# Patient Record
Sex: Male | Born: 1980 | Race: White | Hispanic: No | Marital: Single | Smoking: Never smoker
Health system: Southern US, Community
[De-identification: ages and names within clinical notes are randomized; demographics above are authoritative.]

## PROBLEM LIST (undated history)

## (undated) DIAGNOSIS — N200 Calculus of kidney: Secondary | ICD-10-CM

## (undated) HISTORY — PX: HERNIA REPAIR: SHX51

---

## 1999-09-07 ENCOUNTER — Emergency Department (HOSPITAL_COMMUNITY): Admission: EM | Admit: 1999-09-07 | Discharge: 1999-09-07 | Payer: Self-pay | Admitting: Emergency Medicine

## 1999-09-19 ENCOUNTER — Emergency Department (HOSPITAL_COMMUNITY): Admission: EM | Admit: 1999-09-19 | Discharge: 1999-09-19 | Payer: Self-pay | Admitting: *Deleted

## 2006-12-11 ENCOUNTER — Emergency Department (HOSPITAL_COMMUNITY): Admission: EM | Admit: 2006-12-11 | Discharge: 2006-12-11 | Payer: Self-pay | Admitting: Emergency Medicine

## 2007-04-16 ENCOUNTER — Emergency Department (HOSPITAL_COMMUNITY): Admission: EM | Admit: 2007-04-16 | Discharge: 2007-04-16 | Payer: Self-pay | Admitting: Emergency Medicine

## 2008-06-13 ENCOUNTER — Emergency Department (HOSPITAL_COMMUNITY): Admission: EM | Admit: 2008-06-13 | Discharge: 2008-06-13 | Payer: Self-pay | Admitting: Emergency Medicine

## 2011-03-29 LAB — DIFFERENTIAL
Eosinophils Relative: 1 % (ref 0–5)
Lymphs Abs: 1.7 10*3/uL (ref 0.7–4.0)
Monocytes Relative: 6 % (ref 3–12)
Neutrophils Relative %: 79 % — ABNORMAL HIGH (ref 43–77)

## 2011-03-29 LAB — URINALYSIS, ROUTINE W REFLEX MICROSCOPIC
Bilirubin Urine: NEGATIVE
Ketones, ur: NEGATIVE mg/dL
Nitrite: NEGATIVE
Protein, ur: NEGATIVE mg/dL
Urobilinogen, UA: 0.2 mg/dL (ref 0.0–1.0)

## 2011-03-29 LAB — POCT I-STAT, CHEM 8
Creatinine, Ser: 1.1 mg/dL (ref 0.4–1.5)
Glucose, Bld: 188 mg/dL — ABNORMAL HIGH (ref 70–99)
HCT: 47 % (ref 39.0–52.0)
Hemoglobin: 16 g/dL (ref 13.0–17.0)
Sodium: 141 mEq/L (ref 135–145)
TCO2: 27 mmol/L (ref 0–100)

## 2011-03-29 LAB — CBC
Hemoglobin: 15.1 g/dL (ref 13.0–17.0)
RBC: 5.1 MIL/uL (ref 4.22–5.81)
WBC: 11.3 10*3/uL — ABNORMAL HIGH (ref 4.0–10.5)

## 2011-04-03 LAB — CBC
HCT: 45.7
MCV: 85.6
Platelets: 304
RBC: 5.34

## 2011-04-03 LAB — COMPREHENSIVE METABOLIC PANEL
Alkaline Phosphatase: 112
CO2: 27
Calcium: 9.4
GFR calc Af Amer: >60
Glucose, Bld: 154 — ABNORMAL HIGH
Sodium: 133 — ABNORMAL LOW
Total Bilirubin: 1.2

## 2011-04-03 LAB — LIPASE, BLOOD: Lipase: 20

## 2011-05-07 ENCOUNTER — Emergency Department (HOSPITAL_COMMUNITY)
Admission: EM | Admit: 2011-05-07 | Discharge: 2011-05-08 | Disposition: A | Discharge status: Home / Self Care | Payer: BC Managed Care – PPO | Attending: Emergency Medicine | Admitting: Emergency Medicine

## 2011-05-07 DIAGNOSIS — R55 Syncope and collapse: Secondary | ICD-10-CM | POA: Insufficient documentation

## 2011-05-07 DIAGNOSIS — R42 Dizziness and giddiness: Secondary | ICD-10-CM | POA: Insufficient documentation

## 2011-05-07 HISTORY — DX: Calculus of kidney: N20.0

## 2011-05-08 ENCOUNTER — Other Ambulatory Visit: Payer: Self-pay

## 2011-05-08 ENCOUNTER — Encounter: Payer: Self-pay | Admitting: *Deleted

## 2011-05-08 LAB — DIFFERENTIAL
Eosinophils Relative: 1 % (ref 0–5)
Lymphocytes Relative: 35 % (ref 12–46)
Lymphs Abs: 2.6 10*3/uL (ref 0.7–4.0)
Monocytes Absolute: 0.3 10*3/uL (ref 0.1–1.0)
Monocytes Relative: 5 % (ref 3–12)
Neutrophils Relative %: 59 % (ref 43–77)

## 2011-05-08 LAB — CBC
Hemoglobin: 15.1 g/dL (ref 13.0–17.0)
MCHC: 35.1 g/dL (ref 30.0–36.0)
RDW: 12.7 % (ref 11.5–15.5)
WBC: 7.4 10*3/uL (ref 4.0–10.5)

## 2011-05-08 LAB — URINALYSIS, ROUTINE W REFLEX MICROSCOPIC
Bilirubin Urine: NEGATIVE
Hgb urine dipstick: NEGATIVE
Ketones, ur: NEGATIVE mg/dL
Protein, ur: NEGATIVE mg/dL
Urobilinogen, UA: 0.2 mg/dL (ref 0.0–1.0)
pH: 6.5 (ref 5.0–8.0)

## 2011-05-08 LAB — BASIC METABOLIC PANEL
BUN: 14 mg/dL (ref 6–23)
Calcium: 9.7 mg/dL (ref 8.4–10.5)
GFR calc non Af Amer: >90 mL/min (ref >90)

## 2011-05-08 NOTE — ED Notes (Signed)
SR  On monitor, rate 72, Denies pain or dizziness at this time

## 2011-05-08 NOTE — ED Notes (Signed)
C/o dizziness at 2045 when leaving work.  Denies syncope, pain , SOB, fever , nausea or vomiting.

## 2011-05-08 NOTE — ED Notes (Addendum)
C/o episode of dizziness while walking out of work around 2045, seen by EMS at home, encouraged to come to ED, "wasn't sure if it was anxiety", (denies: tingling, weakness), "feels like adrenalin flowing thru me". "Wasn't sure if it was r/t blood sugar, ate ice cream prior to EMS, but hasn't eaten since 1300".

## 2011-05-08 NOTE — ED Provider Notes (Signed)
History     CSN: [id number] Arrival date & time: 05/07/2011 11:59 PM   First MD Initiated Contact with Patient 05/08/11 0220      Chief Complaint  Patient presents with  . Dizziness    (Consider location/radiation/quality/duration/timing/severity/associated sxs/prior treatment) HPI 30 year old male presents to emergency department with report of dizziness and feeling like he was when pass out tonight around 9 PM after leaving work. Patient denies any chest pain, shortness of breath, vertigo. No prior history of same. Patient denies any medical problems. Patient reports some cold symptoms over the weekend with sinus drainage and stomach upset. Today he worked a normal shift ate lunch around noon and had peanuts around 1:30 had not eaten is that time when he began to feel dizzy 9 hours later. Past Medical History  Diagnosis Date  . Kidney stone     Past Surgical History  Procedure Date  . Hernia repair     Family History  Problem Relation Age of Onset  . Asthma Mother   . Diabetes Father   . Coronary artery disease Father   . Hemochromatosis Father     History  Substance Use Topics  . Smoking status: Never Smoker   . Smokeless tobacco: Not on file  . Alcohol Use: Yes     socially      Review of Systems  Neurological: Positive for dizziness.  All other systems reviewed and are negative.    Allergies  Review of patient's allergies indicates no known allergies.  Home Medications   Current Outpatient Rx  Name Route Sig Dispense Refill  . ACETAMINOPHEN 325 MG PO TABS Oral Take 325 mg by mouth every 6 (six) hours as needed. For pain     . THERA M PLUS PO TABS Oral Take 1 tablet by mouth daily.        BP 118/71  Pulse 49  Temp(Src) 98.4 F (36.9 C) (Oral)  Resp 19  SpO2 100%  Physical Exam  Nursing note and vitals reviewed. Constitutional: He is oriented to person, place, and time. He appears well-developed and well-nourished.  HENT:  Head:  Normocephalic and atraumatic.  Right Ear: External ear normal.  Left Ear: External ear normal.  Nose: Nose normal.  Mouth/Throat: Oropharynx is clear and moist.  Eyes: Conjunctivae and EOM are normal. Pupils are equal, round, and reactive to light.  Neck: Normal range of motion. Neck supple. No JVD present. No tracheal deviation present. No thyromegaly present.  Cardiovascular: Normal rate, regular rhythm, normal heart sounds and intact distal pulses.  Exam reveals no gallop and no friction rub.   No murmur heard. Pulmonary/Chest: Effort normal and breath sounds normal. No stridor. No respiratory distress. He has no wheezes. He has no rales. He exhibits no tenderness.  Abdominal: Soft. Bowel sounds are normal. He exhibits no distension and no mass. There is no tenderness. There is no rebound and no guarding.  Musculoskeletal: Normal range of motion. He exhibits no edema and no tenderness.  Lymphadenopathy:    He has no cervical adenopathy.  Neurological: He is oriented to person, place, and time. He has normal reflexes. No cranial nerve deficit. He exhibits normal muscle tone. Coordination normal.  Skin: Skin is dry. No rash noted. No erythema. No pallor.  Psychiatric: He has a normal mood and affect. His behavior is normal. Judgment and thought content normal.    ED Course  Procedures (including critical care time)  Labs Reviewed  URINALYSIS, ROUTINE W REFLEX MICROSCOPIC - Abnormal; Notable for  the following:    Glucose, UA 100 (*)    All other components within normal limits  CBC  DIFFERENTIAL  BASIC METABOLIC PANEL   No results found.   1. Near syncope     Date: 05/08/2011  Rate:69   Rhythm: normal sinus rhythm  QRS Axis: normal  Intervals: normal  ST/T Wave abnormalities: normal  Conduction Disutrbances:none  Narrative Interpretation:   Old EKG Reviewed: none available    MDM  30 year old male with near syncope. Exam, lab work, EKG all negative. Suspect secondary  to recent illness and prolonged period without eating or drinking. Patient encouraged to eat regular meals and drink plan fluids        Olivia Mackie, MD 05/08/11 [phone]

## 2017-04-07 ENCOUNTER — Ambulatory Visit (INDEPENDENT_AMBULATORY_CARE_PROVIDER_SITE_OTHER): Payer: BLUE CROSS/BLUE SHIELD

## 2017-04-07 ENCOUNTER — Ambulatory Visit (INDEPENDENT_AMBULATORY_CARE_PROVIDER_SITE_OTHER): Payer: BLUE CROSS/BLUE SHIELD | Admitting: Podiatry

## 2017-04-07 ENCOUNTER — Encounter: Payer: Self-pay | Admitting: Podiatry

## 2017-04-07 VITALS — BP 132/89 | HR 69 | Resp 18

## 2017-04-07 DIAGNOSIS — M722 Plantar fascial fibromatosis: Secondary | ICD-10-CM | POA: Diagnosis not present

## 2017-04-07 DIAGNOSIS — J302 Other seasonal allergic rhinitis: Secondary | ICD-10-CM | POA: Insufficient documentation

## 2017-04-07 DIAGNOSIS — M779 Enthesopathy, unspecified: Secondary | ICD-10-CM

## 2017-04-07 DIAGNOSIS — N200 Calculus of kidney: Secondary | ICD-10-CM | POA: Insufficient documentation

## 2017-04-07 DIAGNOSIS — S93401A Sprain of unspecified ligament of right ankle, initial encounter: Secondary | ICD-10-CM

## 2017-04-07 MED ORDER — METHYLPREDNISOLONE 4 MG PO TBPK: ORAL_TABLET | ORAL | 0 refills | Status: AC

## 2017-04-07 MED ORDER — MELOXICAM 15 MG PO TABS: 15.0000 mg | ORAL_TABLET | Freq: Every day | ORAL | 2 refills | Status: AC

## 2017-04-07 NOTE — Patient Instructions (Signed)
Start with the medrol dose pack and once complete you can start the meloxicam. Do not take together.    Achilles Tendinitis Rehab Ask your health care provider which exercises are safe for you. Do exercises exactly as told by your health care provider and adjust them as directed. It is normal to feel mild stretching, pulling, tightness, or discomfort as you do these exercises, but you should stop right away if you feel sudden pain or your pain gets worse. Do not begin these exercises until told by your health care provider. Stretching and range of motion exercises These exercises warm up your muscles and joints and improve the movement and flexibility of your ankle. These exercises also help to relieve pain, numbness, and tingling. Exercise A: Standing wall calf stretch, knee straight  1. Stand with your hands against a wall. 2. Extend your __________ leg behind you and bend your front knee slightly. Keep both of your heels on the floor. 3. Point the toes of your back foot slightly inward. 4. Keeping your heels on the floor and your back knee straight, shift your weight toward the wall. Do not allow your back to arch. You should feel a gentle stretch in your calf. 5. Hold this position for seconds. Repeat __________ times. Complete this stretch __________ times per day. Exercise B: Standing wall calf stretch, knee bent 1. Stand with your hands against a wall. 2. Extend your __________ leg behind you, and bend your front knee slightly. Keep both of your heels on the floor. 3. Point the toes of your back foot slightly inward. 4. Keeping your heels on the floor, unlock your back knee so that it is bent. You should feel a gentle stretch deep in your calf. 5. Hold this position for __________ seconds. Repeat __________ times. Complete this stretch __________ times per day. Strengthening exercises These exercises build strength and control of your ankle. Endurance is the ability to use your muscles  for a long time, even after they get tired. Exercise C: Plantar flexion with band  1. Sit on the floor with your __________ leg extended. You may put a pillow under your calf to give your foot more room to move. 2. Loop a rubber exercise band or tube around the ball of your __________ foot. The ball of your foot is on the walking surface, right under your toes. The band or tube should be slightly tense when your foot is relaxed. If the band or tube slips, you can put on your shoe or put a washcloth between the band and your foot to help it stay in place. 3. Slowly point your toes downward, pushing them away from you. 4. Hold this position for __________ seconds. 5. Slowly release the tension in the band or tube, controlling smoothly until your foot is back to the starting position. Repeat __________ times. Complete this exercise __________ times per day. Exercise D: Heel raise with eccentric lower  1. Stand on a step with the balls of your feet. The ball of your foot is on the walking surface, right under your toes. ? Do not put your heels on the step. ? For balance, rest your hands on the wall or on a railing. 2. Rise up onto the balls of your feet. 3. Keeping your heels up, shift all of your weight to your __________ leg and pick up your other leg. 4. Slowly lower your __________ leg so your heel drops below the level of the step. 5. Put down your  foot. If told by your health care provider, build up to:  3 sets of 15 repetitions while keeping your knees straight.  3 sets of 15 repetitions while keeping your knees bent as far as told by your health care provider.  Complete this exercise __________ times per day. If this exercise is too easy, try doing it while wearing a backpack with weights in it. Balance exercises These exercises improve or maintain your balance. Balance is important in preventing falls. Exercise E: Single leg stand 1. Without shoes, stand near a railing or in a door  frame. Hold on to the railing or door frame as needed. 2. Stand on your __________ foot. Keep your big toe down on the floor and try to keep your arch lifted. 3. Hold this position for __________ seconds. Repeat __________ times. Complete this exercise __________ times per day. If this exercise is too easy, you can try it with your eyes closed or while standing on a pillow. This information is not intended to replace advice given to you by your health care provider. Make sure you discuss any questions you have with your health care provider. Document Released: 01/09/2005 Document Revised: 02/15/2016 Document Reviewed: 02/14/2015 Elsevier Interactive Patient Education  [employer].

## 2017-04-07 NOTE — Progress Notes (Signed)
Subjective:    Patient ID: Dave Carter, male    DOB: 14-Feb-1981, 36 y.o.   MRN: [id number]  HPI  Dave Carter the office today for concerns of pain that was ankle pointing to the Achilles tendon which is been ongoing for about 2 weeks. He states that filled bruised at times. 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 He states that before this happened he was a lot of walking in [name]. No pain when walking. Denies any recent injury or trauma. He also describes as an aching sensation. He states he walked differently some pain to the ankle itself. No numbness or tingling. The pain does not wake him up at night. No recent treatment. No other complaints today.  Review of Systems  All other systems reviewed and are negative.  Past Medical History:  Diagnosis Date  . Kidney stone     Past Surgical History:  Procedure Laterality Date  . HERNIA REPAIR       Current Outpatient Prescriptions:  .  acetaminophen (TYLENOL) 325 MG tablet, Take 325 mg by mouth every 6 (six) hours as needed. For pain , Disp: , Rfl:  .  meloxicam (MOBIC) 15 MG tablet, Take 1 tablet (15 mg total) by mouth daily., Disp: 30 tablet, Rfl: 2 .  methylPREDNISolone (MEDROL DOSEPAK) 4 MG TBPK tablet, Take as directed, Disp: 21 tablet, Rfl: 0 .  Multiple Vitamins-Minerals (MULTIVITAMINS THER. W/MINERALS) TABS, Take 1 tablet by mouth daily.  , Disp: , Rfl:   No Known Allergies  Social History   Social History  . Marital status: Single    Spouse name: N/A  . Number of children: N/A  . Years of education: N/A   Occupational History  . Not on file.   Social History Main Topics  . Smoking status: Never Smoker  . Smokeless tobacco: Never Used  . Alcohol use Yes     Comment: socially  . Drug use: No  . Sexual activity: Not on file   Other Topics Concern  . Not on file   Social History Narrative  . No  narrative on file         Objective:   Physical Exam  General: AAO x3, NAD  Dermatological: Skin is warm, dry and supple bilateral. Nails x 10 are well manicured; remaining integument appears unremarkable at this time. There are no open sores, no preulcerative lesions, no rash or signs of infection present.  Vascular: Dorsalis Pedis artery and Posterior Tibial artery pedal pulses are 2/4 bilateral with immedate capillary fill time. Pedal hair growth present. No varicosities and no lower extremity edema present bilateral. There is no pain with calf compression, swelling, warmth, erythema.   Neruologic: Grossly intact via light touch bilateral.  Protective threshold with Semmes Wienstein monofilament intact to all pedal sites bilateral.  Musculoskeletal: NThere is tenderness along the mid substance the Achilles tendon but Janee Morn test is negative and there is no defect noted. There is no hypertrophy the tendon. This tenderness on the insertion along the posterior calcaneus. Equinus is present. There is no pain with lateral compression of the calcaneus. His the course of the plantar fascia mild tenderness on the plantar medial tubercle of the calcaneus at insertion of plantar fascia. ankle, subtalar joint range of motion intact. no area pinpoint bony tenderness or pain the vibratory sensation. Muscular strength 5/5 in all groups tested  bilateral.  Gait: Unassisted, Nonantalgic.      Assessment & Plan:  36 year old male with Achilles tendinitis right side -Treatment options discussed including all alternatives, risks, and complications -Etiology of symptoms were discussed -X-rays were obtained and reviewed with the patient. No evidence of acute fracture identified. -Start taking the Medrol Dosepak. Once this is complete and transition to the meloxicam as needed. -Stretching exercises daily. -Ice to the area daily. -Ankle brace  -Discussed shoe gear modifications inserts -Follow-up in 4  weeks or sooner if needed. Call any questions or concerns. He had no further questions today.  Ovid Curd, DPM

## 2017-05-05 ENCOUNTER — Ambulatory Visit: Payer: BLUE CROSS/BLUE SHIELD | Admitting: Podiatry

## 2017-05-05 DIAGNOSIS — M779 Enthesopathy, unspecified: Secondary | ICD-10-CM

## 2017-05-05 NOTE — Patient Instructions (Signed)

## 2017-05-08 NOTE — Progress Notes (Signed)
Subjective: Dave Carter presents the office today for follow-up evaluation of right Achilles tendinitis.  He states that overall he is doing much better.  He states he did not expect the pain to be resolved today but he is happy with the progress so far.  He did finish the oral steroid he is taken meloxicam intermittently.  He does with ankle brace but he feels it is hard to actually cause pain.  He denies any recent injury or trauma.  Denies any increase in swelling or redness of his feet.  He has no other concerns. Denies any systemic complaints such as fevers, chills, nausea, vomiting. No acute changes since last appointment, and no other complaints at this time.   Objective: AAO x3, NAD DP/PT pulses palpable bilaterally, CRT less than 3 seconds There is no significant discomfort along the Achilles tendon or along the insertion.  There is no thickening of the tendon present.  Defect is not present Dave Carter test is negative.  Mild equinus is present.  There is no pain to the plantar fascial insertion.  No pain with lateral compression.  There is no overlying edema, erythema, increased warmth.  No other areas of tenderness. No open lesions or pre-ulcerative lesions.  No pain with calf compression, swelling, warmth, erythema  Assessment: Resolving right Achilles tendinitis, foot pain.  Plan: -All treatment options discussed with the patient including all alternatives, risks, complications.  -At this point his symptoms are much improved.  I want him to continue the anti-inflammatory as needed but I went to work more on stretching, icing exercises daily.  Also discussed the change in shoes as well as inserts for shoes.  He can hold off an ankle brace as needed.  Call the next couple weeks if symptoms continue or if there is any worsening otherwise I will see him back as needed as his pain is much improved. -Patient encouraged to call the office with any questions, concerns, change in symptoms.   Dave Carter DPM

## 2018-04-14 ENCOUNTER — Encounter: Payer: Self-pay | Admitting: Podiatry

## 2018-04-14 ENCOUNTER — Telehealth: Payer: Self-pay | Admitting: *Deleted

## 2018-04-14 ENCOUNTER — Ambulatory Visit (INDEPENDENT_AMBULATORY_CARE_PROVIDER_SITE_OTHER): Payer: BLUE CROSS/BLUE SHIELD

## 2018-04-14 ENCOUNTER — Ambulatory Visit: Payer: BLUE CROSS/BLUE SHIELD | Admitting: Podiatry

## 2018-04-14 ENCOUNTER — Other Ambulatory Visit: Payer: Self-pay | Admitting: Podiatry

## 2018-04-14 DIAGNOSIS — S90851A Superficial foreign body, right foot, initial encounter: Secondary | ICD-10-CM | POA: Diagnosis not present

## 2018-04-14 DIAGNOSIS — M7741 Metatarsalgia, right foot: Secondary | ICD-10-CM

## 2018-04-14 DIAGNOSIS — M79671 Pain in right foot: Secondary | ICD-10-CM

## 2018-04-14 MED ORDER — MELOXICAM 15 MG PO TABS: 15.0000 mg | ORAL_TABLET | Freq: Every day | ORAL | 0 refills | Status: DC

## 2018-04-14 NOTE — Telephone Encounter (Signed)
-----   Message from Vivi Barrack, DPM sent at 04/14/2018  3:40 PM EDT ----- Can you please order a diagnostic ultrasound to look at foreign body of the right foot; 5th mtpj

## 2018-04-14 NOTE — Telephone Encounter (Signed)
Faxed orders to [facility] Scheduling. 

## 2018-04-16 NOTE — Progress Notes (Signed)
Subjective: 37 year old male presents today for pain to the left fifth toe which started about 2 months ago.  He denies any recent injury or trauma.  States the pain gets more severe after doing walking activities and being on his feet.  He states he does not want an injection.  He denies any redness or swelling to his feet except for along the fifth toe at times.  He is actually not point today points more to the fifth metatarsal head area where he gets his symptoms.  In general he gets some discomfort in the ball the foot as well after being on his feet.  This is been an ongoing issue in regards to the area. Denies any systemic complaints such as fevers, chills, nausea, vomiting. No acute changes since last appointment, and no other complaints at this time.   Objective: AAO x3, NAD DP/PT pulses palpable bilaterally, CRT less than 3 seconds There is mild edema present to the lateral fifth metatarsal head appears to be localized.  Almost appears to the [place] area there is no puncture wound or open sore identified there is no erythema or warmth.  Mild tailor's bunion is present.  Minimal discomfort and subjectively there is tenderness along submetatarsal heads 1-5.  Otherwise the areas of pain was seen in for before are no longer having any symptoms.  No open lesions or pre-ulcerative lesions.  No pain with calf compression, swelling, warmth, erythema  Assessment: Foreign body right foot, metatarsalgia  Plan: -All treatment options discussed with the patient including all alternatives, risks, complications.  -X-rays were obtained reviewed which does reveal foreign body in the soft tissues appears to be lateral and plantar to the fifth MPJ.  Due to this I would order an ultrasound.  He cannot identify when this occurred and does not recall. -I added a metatarsal pad to his insert.  He has a super feet insert. -We discussed surgery and he was hesitant for this.  We will await the ultrasound results  before proceeding. -Patient encouraged to call the office with any questions, concerns, change in symptoms.   Vivi Barrack DPM

## 2018-04-20 ENCOUNTER — Ambulatory Visit
Admission: RE | Admit: 2018-04-20 | Discharge: 2018-04-20 | Disposition: A | Discharge status: Home / Self Care | Payer: BLUE CROSS/BLUE SHIELD | Source: Ambulatory Visit | Attending: Podiatry | Admitting: Podiatry

## 2018-04-20 DIAGNOSIS — M79671 Pain in right foot: Secondary | ICD-10-CM

## 2018-04-20 DIAGNOSIS — S90851A Superficial foreign body, right foot, initial encounter: Secondary | ICD-10-CM

## 2018-04-22 ENCOUNTER — Telehealth: Payer: Self-pay | Admitting: *Deleted

## 2018-04-22 NOTE — Telephone Encounter (Signed)
-----   Message from Vivi Barrack, DPM sent at 04/21/2018  5:59 PM EDT ----- Val- please let him know that the ultrasound does show the foreign body as well as some fluid around the foreign body. I would recommend likely trying to remove the foreign body. He was hesitant on surgery but let him know that the ultrasound does show this, and possible abscess (although I think it is more just fluid) and have him come in to be seen. Thanks.

## 2018-04-22 NOTE — Telephone Encounter (Signed)
I informed pt of Dr. Gabriel Rung review of results and recommendations. Transferred pt to schedulers.

## 2018-04-22 NOTE — Telephone Encounter (Signed)
Pt left message to call again to discuss results fof [place].

## 2018-04-22 NOTE — Telephone Encounter (Signed)
Left message requesting call back to discuss [place] results and orders.

## 2018-04-23 ENCOUNTER — Ambulatory Visit: Payer: BLUE CROSS/BLUE SHIELD | Admitting: Podiatry

## 2018-04-23 ENCOUNTER — Ambulatory Visit (INDEPENDENT_AMBULATORY_CARE_PROVIDER_SITE_OTHER): Payer: BLUE CROSS/BLUE SHIELD

## 2018-04-23 ENCOUNTER — Other Ambulatory Visit: Payer: Self-pay | Admitting: Podiatry

## 2018-04-23 DIAGNOSIS — M79671 Pain in right foot: Secondary | ICD-10-CM

## 2018-04-23 DIAGNOSIS — S90851D Superficial foreign body, right foot, subsequent encounter: Secondary | ICD-10-CM

## 2018-04-23 DIAGNOSIS — S90851A Superficial foreign body, right foot, initial encounter: Secondary | ICD-10-CM

## 2018-04-28 DIAGNOSIS — S90851A Superficial foreign body, right foot, initial encounter: Secondary | ICD-10-CM | POA: Insufficient documentation

## 2018-04-28 NOTE — Progress Notes (Signed)
Subjective: 37 year old male presents the office today for follow-up evaluation of foreign body right foot.  He presents today to discuss ultrasound results and options for treatment at this point.  Denies any increase in swelling or redness to the area and occasionally gets pain from the swelling has been about the same. Denies any systemic complaints such as fevers, chills, nausea, vomiting. No acute changes since last appointment, and no other complaints at this time.   Objective: AAO x3, NAD DP/PT pulses palpable bilaterally, CRT less than 3 seconds Mild edema to the lateral aspect of the fifth metatarsal head on the right foot.  There is no significant erythema there is no increase in warmth.  Tenderness palpation of this area.  No open lesions or pre-ulcerative lesions.  No pain with calf compression, swelling, warmth, erythema  Assessment: Foreign body right foot  Plan: -All treatment options discussed with the patient including all alternatives, risks, complications.  -New x-rays were obtained and read lucency appears in the same position.  Ultrasound results were discussed.  At this time we discussed multiple treatment options.  I do recommend trying to remove the foreign body since is causing pain.  I discussed into the operating room he wants to try to do the office under local anesthesia.  We discussed the procedure as well as postoperative course we discussed I am not able to remove it in the office.  He wants to try.  Were going to reappoint him to get this done after he has a trip coming up.  Monitoring signs or symptoms of infection. -Patient encouraged to call the office with any questions, concerns, change in symptoms.   Vivi Barrack DPM

## 2018-05-05 ENCOUNTER — Telehealth: Payer: Self-pay | Admitting: *Deleted

## 2018-05-05 NOTE — Telephone Encounter (Signed)
I left the patient a message to give me a call back.  I was calling to see if I could schedule his office surgery for an earlier time tomorrow.

## 2018-05-06 ENCOUNTER — Ambulatory Visit (INDEPENDENT_AMBULATORY_CARE_PROVIDER_SITE_OTHER): Payer: BLUE CROSS/BLUE SHIELD

## 2018-05-06 ENCOUNTER — Encounter: Payer: Self-pay | Admitting: Podiatry

## 2018-05-06 ENCOUNTER — Ambulatory Visit: Payer: BLUE CROSS/BLUE SHIELD | Admitting: Podiatry

## 2018-05-06 ENCOUNTER — Ambulatory Visit: Payer: BLUE CROSS/BLUE SHIELD

## 2018-05-06 DIAGNOSIS — S90851D Superficial foreign body, right foot, subsequent encounter: Secondary | ICD-10-CM

## 2018-05-06 MED ORDER — HYDROCODONE-ACETAMINOPHEN 5-325 MG PO TABS: 1.0000 | ORAL_TABLET | Freq: Four times a day (QID) | ORAL | 0 refills | Status: DC | PRN

## 2018-05-06 NOTE — Progress Notes (Signed)
Subjective: 37 year old male presents the office today to remove the foreign body to his right foot.  The area still painful and causing some swelling.  This point was to have the area removed and he wants to do it in the office and try to avoid the surgery center if possible. Denies any systemic complaints such as fevers, chills, nausea, vomiting. No acute changes since last appointment, and no other complaints at this time.   Objective: AAO x3, NAD DP/PT pulses palpable bilaterally, CRT less than 3 seconds Continuation of tenderness on the lateral aspect of the right fifth metatarsal head and there is localized edema but there is no erythema or increase in warmth.  No drainage or pus identified and there is no open sores. No open lesions or pre-ulcerative lesions.  No pain with calf compression, swelling, warmth, erythema  Assessment: Residual foreign body right foot  Plan: -All treatment options discussed with the patient including all alternatives, risks, complications.  -Preoperative x-rays were obtained and I marked the area of the foreign body. -Today we discussed surgical excision of the foreign body.  We discussed the surgery as well as postoperative course.  We discussed alternatives, risks, complications including but not limited to infection, residual foreign body, need for further surgery or need to go to the operating room to remove the foreign body, loss of foot along with general risks of surgery.  Surgical consent was signed. -The patient was brought to the procedure room suite.  The area was blocked in a regional block fashion after the areas with alcohol with a mixture of 3 cc of lidocaine and Marcaine plain.  An additional 2 cc was utilized during the procedure to ensure anesthesia.  The right lower extremities and scrubbed, prepped, draped in normal sterile fashion.  A timeout was performed.  The tourniquet was then inflated to 250 mmHg after the foot was exsanguinated.  Areas  cleaned with Betadine.  A small 1 cm incision was made along the area that was marked preoperatively.  Blunt dissection was then carried out with a hemostat.  At this time is able to identify a piece of clear glass which was removed.  This appeared to go down to the fat layer.  I did show it to him.  Upon further evaluation not able to palpate any further evidence of foreign body.  Incision was irrigated.  Nylon suture was then placed in a single interrupted suture fashion.He tolerated well without any complications. -Postprocedure x-rays were obtained which revealed the foreign body has been removed. -Surgical shoe dispensed. -Prescribed Vicodin for pain -Post procedure instructions discussed. -Patient encouraged to call the office with any questions, concerns, change in symptoms.   Vivi BarrackMatthew R Dustina Scoggin DPM

## 2018-05-08 ENCOUNTER — Telehealth: Payer: Self-pay | Admitting: *Deleted

## 2018-05-08 ENCOUNTER — Other Ambulatory Visit: Payer: Self-pay | Admitting: Podiatry

## 2018-05-08 NOTE — Telephone Encounter (Signed)
Error

## 2018-05-11 ENCOUNTER — Ambulatory Visit (INDEPENDENT_AMBULATORY_CARE_PROVIDER_SITE_OTHER): Payer: BLUE CROSS/BLUE SHIELD | Admitting: Podiatry

## 2018-05-11 ENCOUNTER — Encounter: Payer: Self-pay | Admitting: Podiatry

## 2018-05-11 DIAGNOSIS — S90851A Superficial foreign body, right foot, initial encounter: Secondary | ICD-10-CM

## 2018-05-18 NOTE — Progress Notes (Signed)
Subjective: Dave ReiningJamie L Carter is a 37 y.o. is seen today in office s/p right foot foreign body removal preformed on 05/06/2018.  He states that he has some discomfort the first couple days but he is able to get back into a shoe yesterday.  Overall he is doing better.  Denies any systemic complaints such as fevers, chills, nausea, vomiting. No calf pain, chest pain, shortness of breath.   Objective: General: No acute distress, AAOx3  DP/PT pulses palpable 2/4, CRT < 3 sec to all digits.  Protective sensation intact. Motor function intact.  RIGHT foot: Incision is well coapted without any evidence of dehiscence and sutures are intact. There is no surrounding erythema, ascending cellulitis, fluctuance, crepitus, malodor, drainage/purulence. There is trace edema around the surgical site. There is minimal pain along the surgical site.  No other areas of tenderness to bilateral lower extremities.  No other open lesions or pre-ulcerative lesions.  No pain with calf compression, swelling, warmth, erythema.   Assessment and Plan:  Status post right foot foreign body excision, doing well with no complications   -Treatment options discussed including all alternatives, risks, and complications -Overall he is healing well.  Antibiotic ointment was applied followed by dressing.  Keep the dressing clean, dry, intact for now but he can start to change every other day if needed.  He can start to shower later this week as well dry the area thoroughly. -Ice/elevation -Pain medication as needed. -Monitor for any clinical signs or symptoms of infection and DVT/PE and directed to call the office immediately should any occur or go to the ER. -Follow-up next week for suture removal or sooner if any problems arise. In the meantime, encouraged to call the office with any questions, concerns, change in symptoms.   Ovid CurdMatthew Danica Camarena, DPM

## 2018-05-19 ENCOUNTER — Encounter: Payer: Self-pay | Admitting: Podiatry

## 2018-05-19 ENCOUNTER — Ambulatory Visit (INDEPENDENT_AMBULATORY_CARE_PROVIDER_SITE_OTHER): Payer: Self-pay | Admitting: Podiatry

## 2018-05-19 DIAGNOSIS — Z9889 Other specified postprocedural states: Secondary | ICD-10-CM

## 2018-05-19 DIAGNOSIS — S90851D Superficial foreign body, right foot, subsequent encounter: Secondary | ICD-10-CM

## 2018-05-25 NOTE — Progress Notes (Signed)
Subjective: Dave Carter is a 37 y.o. is seen today in office s/p right foot foreign body removal preformed on 05/06/2018.  He presents today for suture removal.  Overall he states he is doing well.  Is been wearing a regular shoe and he actually played hockey for last 3 days.  Overall he feels well and denies any systemic complaints such as fevers, chills, nausea, vomiting. No calf pain, chest pain, shortness of breath.   Objective: General: No acute distress, AAOx3  DP/PT pulses palpable 2/4, CRT < 3 sec to all digits.  Protective sensation intact. Motor function intact.  RIGHT foot: Incision is well coapted without any evidence of dehiscence and sutures are intact.  Incision appears to be healed.  There is no edema, erythema, drainage or pus there is no clinical signs of infection noted today.  Overall there is no swelling to the foot and he is doing much better.  He states he is feeling much better since having the procedure done.   No other areas of tenderness to bilateral lower extremities.  No other open lesions or pre-ulcerative lesions.  No pain with calf compression, swelling, warmth, erythema.   Assessment and Plan:  Status post right foot foreign body excision, doing well with no complications   -Treatment options discussed including all alternatives, risks, and complications -Sutures removed without complications.  After removal incision remained well coapted.  Antibiotic ointment and a bandage was applied.  Continue the bandage daily.  He can continue with regular shoe.  Hold off on hockey for the next couple days as the incision continues to heal.  Monitoring signs or symptoms of infection.  This point we will see him back as needed and call any questions or concerns or any changes and he agrees with this.  Vivi BarrackMatthew R Wagoner DPM

## 2018-06-01 ENCOUNTER — Ambulatory Visit (INDEPENDENT_AMBULATORY_CARE_PROVIDER_SITE_OTHER): Payer: BLUE CROSS/BLUE SHIELD | Admitting: Podiatry

## 2018-06-01 DIAGNOSIS — Z9889 Other specified postprocedural states: Secondary | ICD-10-CM

## 2018-11-11 IMAGING — US US EXTREM LOW*R* LIMITED
1 series · 14 of 25 positions shown · non-contrast
Comparison: AP and lateral views of the right foot dated [place]

CLINICAL DATA: Eight month history of a foreign body in the right
foot.

EXAM:
ULTRASOUND right LOWER EXTREMITY LIMITED
TECHNIQUE: Ultrasound examination of the lower extremity soft tissues was
performed in the area of clinical concern.

[Series 1: us extrem low*right* limited · 0.02mm/px · 38 acquisitions, 14 frames shown]
[im 1/38]
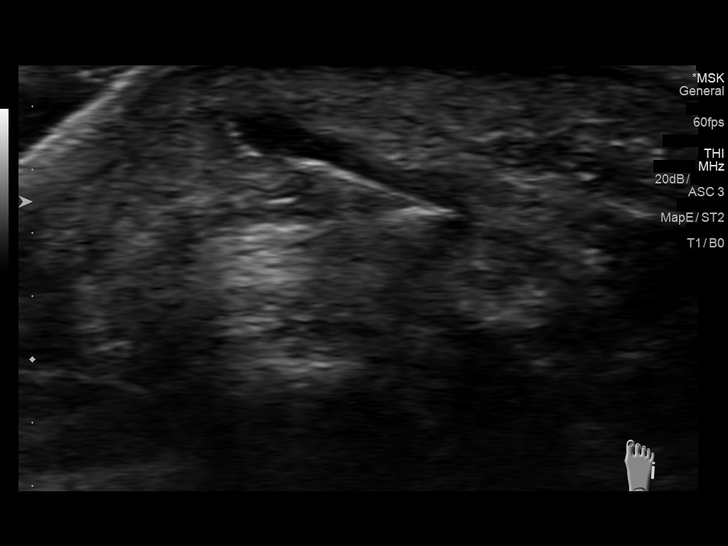
[im 4/38]
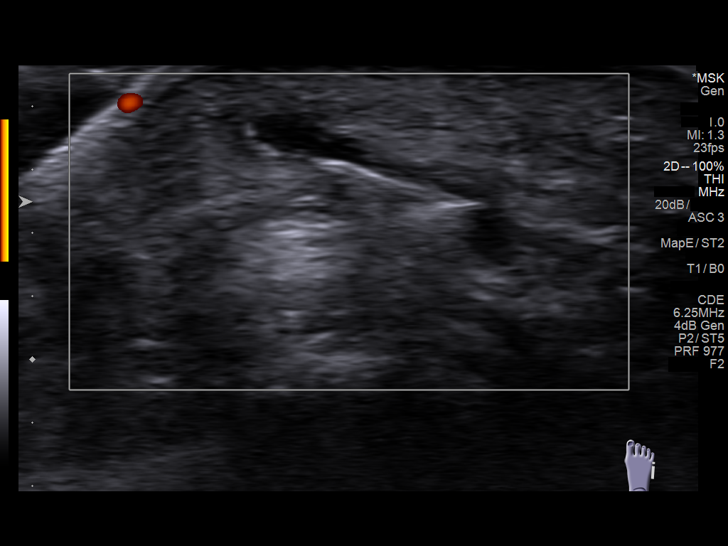
[im 7/38]
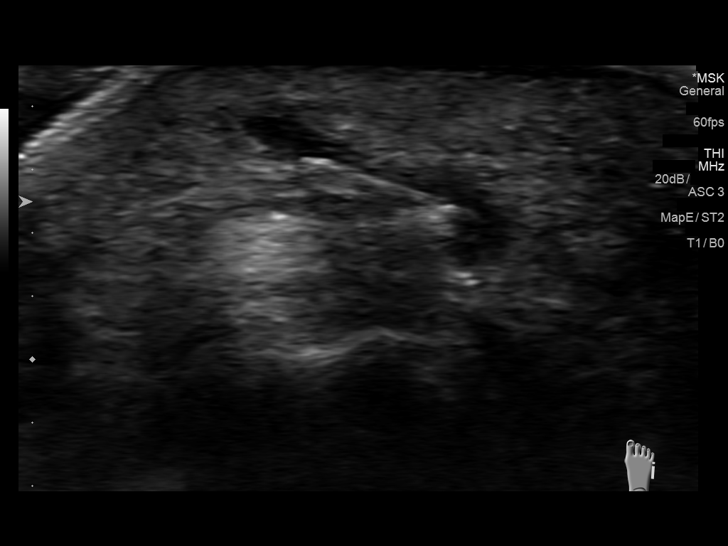
[im 10/38]
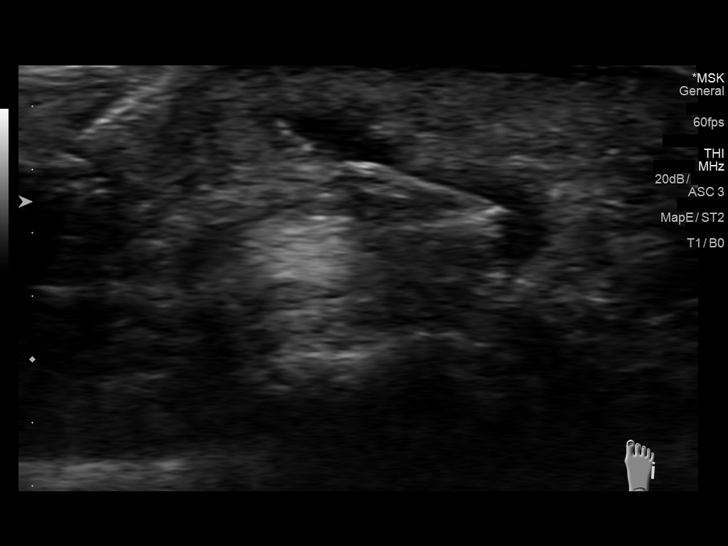
[im 13/38]
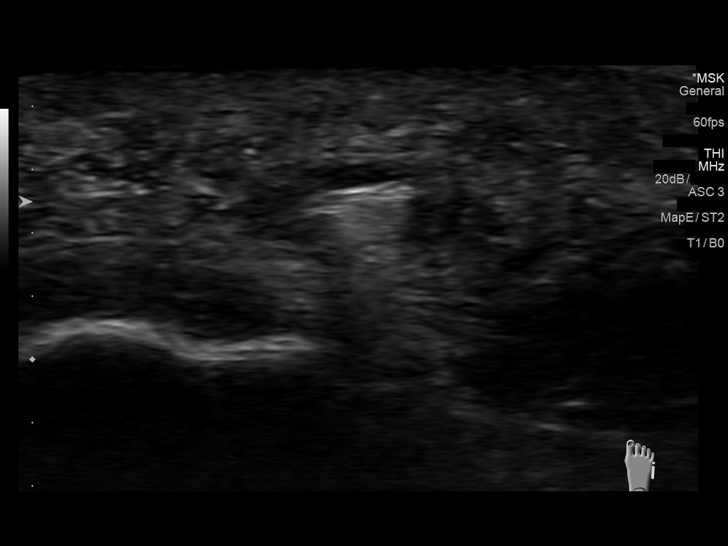
[im 14/38]
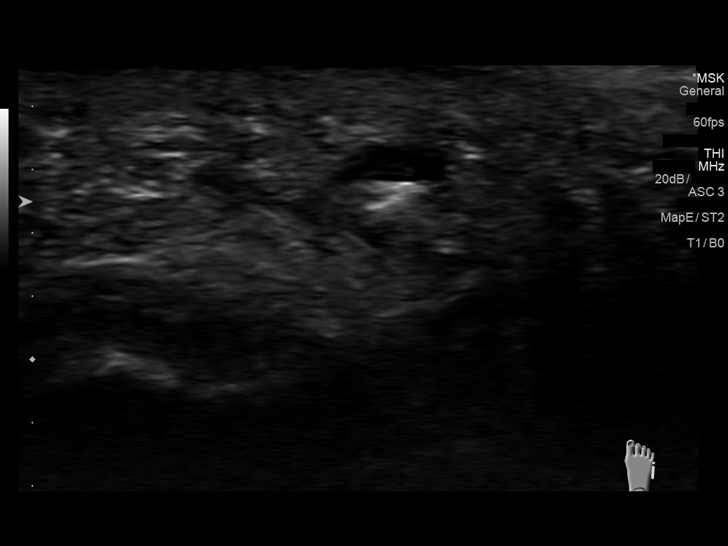
[im 17/38]
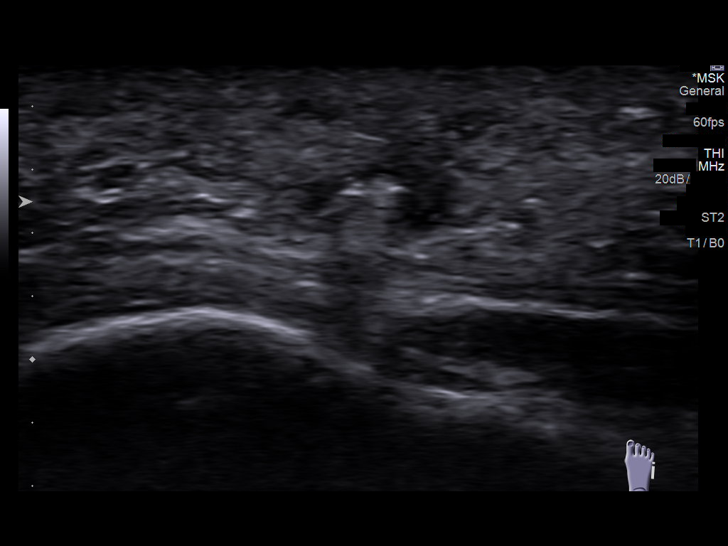
[im 21/38]
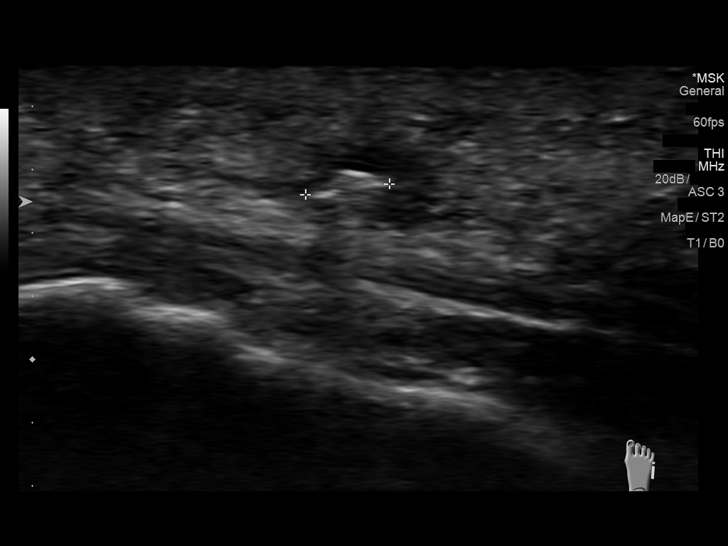
[im 24/38]
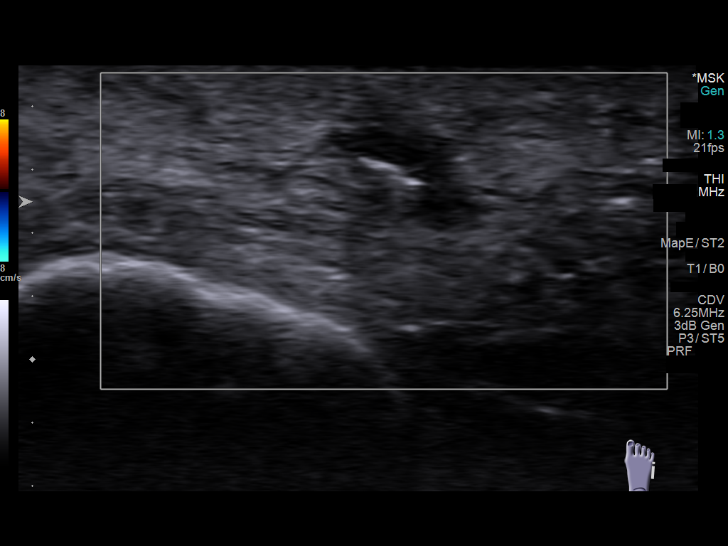
[im 25/38]
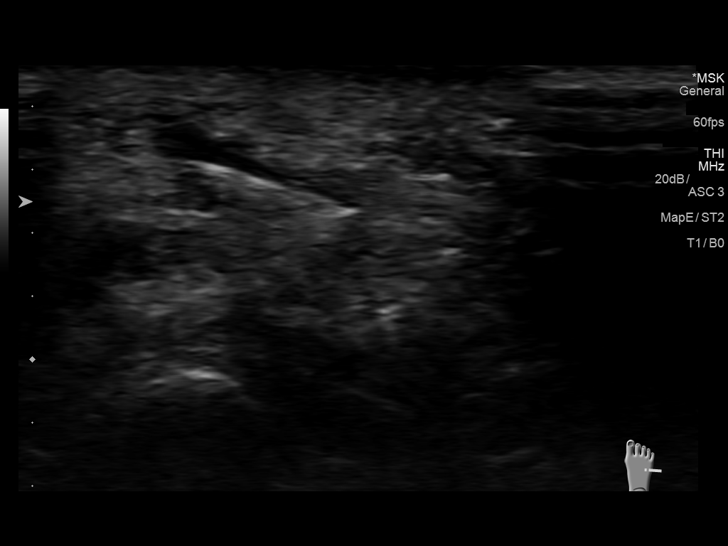
[im 28/38]
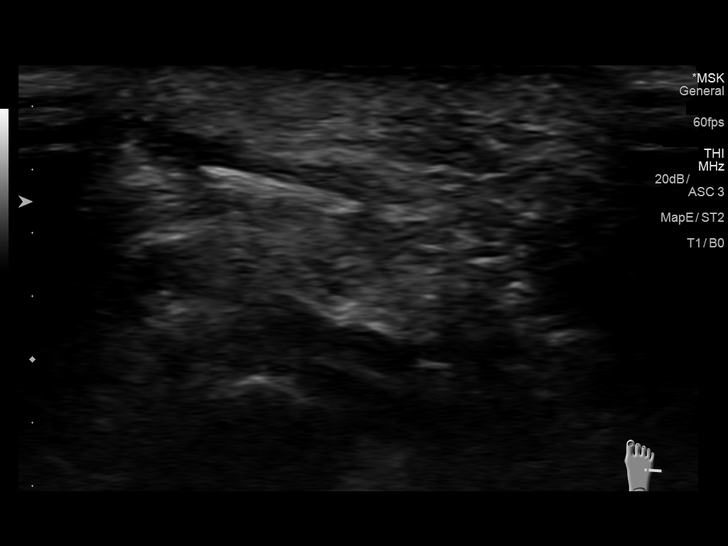
[im 31/38]
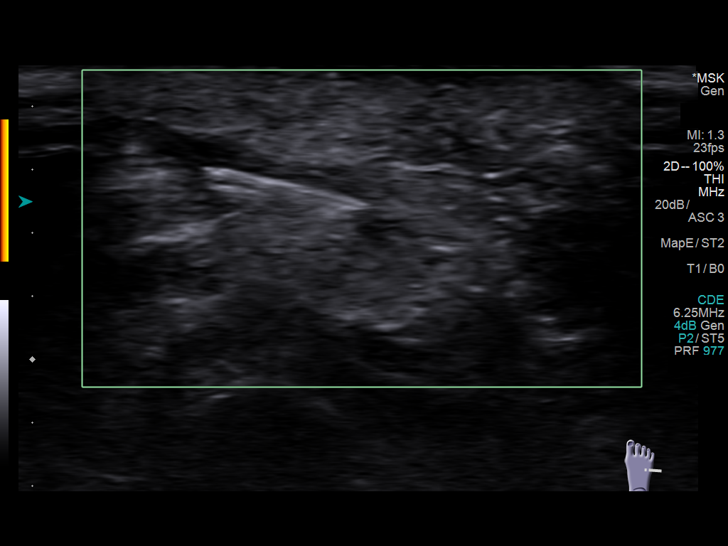
[im 34/38]
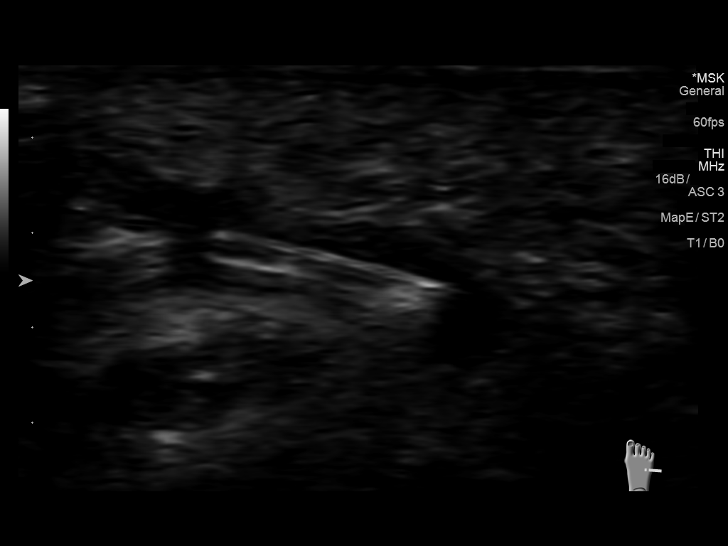
[im 38/38]
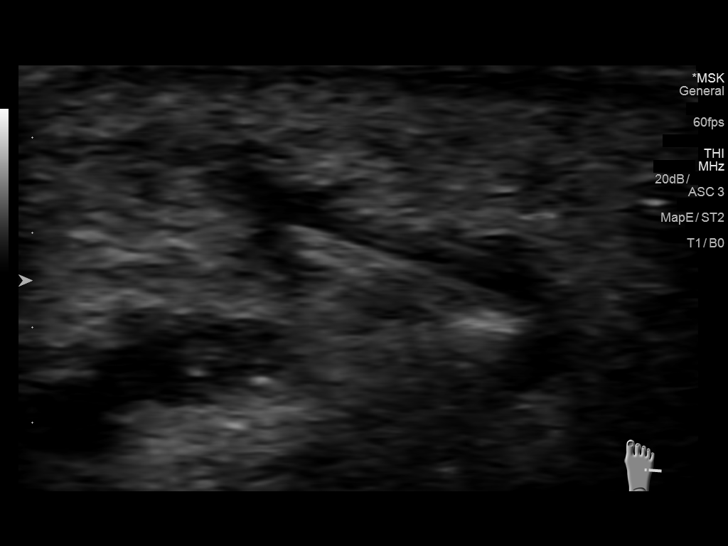

[14 of 25 positions shown; findings below may reference images not displayed]

FINDINGS: In the area of clinical concern there is a linear echogenic focus
with a small amount of surrounding fluid. The echogenic structure
measures 3 x 6 mm. The surrounding fluid collection is slightly
larger. This corresponds to the radiodense focus seen on the right
foot series of April 14, 2018.
IMPRESSION: Echogenic foreign body with small amount of surrounding fluid in the
soft tissues lateral to the head of the fifth metatarsal. This
corresponds to the radiopaque foreign body on the outside x-ray
series April 14, 2018. The fluid may reflect the presence of an
abscess, sterile fluid collection, or old blood.

## 2019-04-25 ENCOUNTER — Other Ambulatory Visit: Payer: Self-pay

## 2019-04-25 ENCOUNTER — Emergency Department (HOSPITAL_BASED_OUTPATIENT_CLINIC_OR_DEPARTMENT_OTHER)
Admission: EM | Admit: 2019-04-25 | Discharge: 2019-04-25 | Disposition: A | Discharge status: Home / Self Care | Payer: BC Managed Care – PPO | Attending: Emergency Medicine | Admitting: Emergency Medicine

## 2019-04-25 ENCOUNTER — Emergency Department (HOSPITAL_BASED_OUTPATIENT_CLINIC_OR_DEPARTMENT_OTHER): Payer: BC Managed Care – PPO

## 2019-04-25 DIAGNOSIS — S4992XA Unspecified injury of left shoulder and upper arm, initial encounter: Secondary | ICD-10-CM | POA: Diagnosis present

## 2019-04-25 DIAGNOSIS — S43112A Subluxation of left acromioclavicular joint, initial encounter: Secondary | ICD-10-CM | POA: Insufficient documentation

## 2019-04-25 DIAGNOSIS — W500XXA Accidental hit or strike by another person, initial encounter: Secondary | ICD-10-CM | POA: Diagnosis not present

## 2019-04-25 DIAGNOSIS — Y92330 Ice skating rink (indoor) (outdoor) as the place of occurrence of the external cause: Secondary | ICD-10-CM | POA: Diagnosis not present

## 2019-04-25 DIAGNOSIS — Z79899 Other long term (current) drug therapy: Secondary | ICD-10-CM | POA: Insufficient documentation

## 2019-04-25 DIAGNOSIS — Y999 Unspecified external cause status: Secondary | ICD-10-CM | POA: Insufficient documentation

## 2019-04-25 DIAGNOSIS — Y9322 Activity, ice hockey: Secondary | ICD-10-CM | POA: Diagnosis not present

## 2019-04-25 DIAGNOSIS — S43102A Unspecified dislocation of left acromioclavicular joint, initial encounter: Secondary | ICD-10-CM

## 2019-04-25 MED ORDER — HYDROCODONE-ACETAMINOPHEN 5-325 MG PO TABS: 2.0000 | ORAL_TABLET | Freq: Four times a day (QID) | ORAL | 0 refills | Status: AC | PRN

## 2019-04-25 MED ORDER — IBUPROFEN 600 MG PO TABS: 600.0000 mg | ORAL_TABLET | Freq: Four times a day (QID) | ORAL | 0 refills | Status: AC | PRN

## 2019-04-25 NOTE — Discharge Instructions (Addendum)
He was seen in the ER for the shoulder injury.  We do notice that you have a condition called AC separation of the shoulder.  The treatment is conservative, and you will need to see an orthopedist in 5 to 7 days for appropriate management.

## 2019-04-25 NOTE — ED Notes (Addendum)
Pt reports playing hockey and falling into wall onto L shoulder- pt voices hearing a pop

## 2019-04-25 NOTE — ED Triage Notes (Signed)
Pt reports left shoulder injury while playing hockey. Pt states shoulder slammed into a wall. Pt ambulatory. Pt denies neck pain or back pain. Pt denies otc meds pta

## 2019-04-26 NOTE — ED Provider Notes (Addendum)
[facility] EMERGENCY DEPARTMENT Provider Note   CSN: [id number] Arrival date & time: 04/25/19  1858     History   Chief Complaint No chief complaint on file.   HPI Dave Carter is a 38 y.o. male.     HPI  38 year old male comes in a chief complaint of shoulder injury.  Patient reports that he was playing hockey when he was checked, and in the process he hurt his left shoulder.  He immediately heard a pop and he felt like his shoulder went in and out.  He is not having any numbness, tingling and is able to move his shoulder in certain directions but has significant discomfort with some positions.  He has no history of injury to the shoulder before.  Past Medical History:  Diagnosis Date  . Kidney stone     Patient Active Problem List   Diagnosis Date Noted  . Foreign body in right foot 04/28/2018  . Kidney stone 04/07/2017  . Seasonal allergies 04/07/2017    Past Surgical History:  Procedure Laterality Date  . HERNIA REPAIR          Home Medications    Prior to Admission medications   Medication Sig Start Date End Date Taking? Authorizing Provider  metFORMIN (GLUCOPHAGE) 500 MG tablet Take by mouth. 08/21/18  Yes [provider]  rosuvastatin (CRESTOR) 10 MG tablet Take by mouth. 04/08/19  Yes [provider]  acetaminophen (TYLENOL) 325 MG tablet Take 325 mg by mouth every 6 (six) hours as needed. For pain     [provider]  cetirizine (ZYRTEC) 10 MG tablet Take by mouth.    [provider]  HYDROcodone-acetaminophen (NORCO/VICODIN) 5-325 MG tablet Take 2 tablets by mouth every 6 (six) hours as needed for severe pain. 04/25/19   Derwood Kaplan, MD  ibuprofen (ADVIL) 600 MG tablet Take 1 tablet (600 mg total) by mouth every 6 (six) hours as needed. 04/25/19   Derwood Kaplan, MD  meloxicam (MOBIC) 15 MG tablet TAKE 1 TABLET BY MOUTH EVERY DAY 05/08/18   Vivi Barrack, DPM  metFORMIN (GLUCOPHAGE) 500 MG tablet   02/26/18   [provider]  methylPREDNISolone (MEDROL DOSEPAK) 4 MG TBPK tablet Take as directed 04/07/17   Vivi Barrack, DPM  Multiple Vitamins-Minerals (MULTIVITAMINS THER. W/MINERALS) TABS Take 1 tablet by mouth daily.      [provider]    Family History Family History  Problem Relation Age of Onset  . Asthma Mother   . Diabetes Father   . Coronary artery disease Father   . Hemochromatosis Father     Social History Social History   Tobacco Use  . Smoking status: Never Smoker  . Smokeless tobacco: Never Used  Substance Use Topics  . Alcohol use: Yes    Comment: socially  . Drug use: No     Allergies   Patient has no known allergies.   Review of Systems Review of Systems  Constitutional: Positive for activity change.  Musculoskeletal: Positive for arthralgias.  Skin: Negative for wound.  Neurological: Negative for numbness.     Physical Exam Updated Vital Signs BP (!) 156/98   Pulse 60   Temp 98.1 F (36.7 C)   Resp 18   Ht 5\' 10"  (1.778 m)   Wt 79.4 kg   SpO2 100%   BMI 25.11 kg/m   Physical Exam Vitals signs and nursing note reviewed.  Constitutional:      Appearance: He is well-developed.  HENT:     Head: Atraumatic.  Neck:     Musculoskeletal: Neck supple.  Cardiovascular:     Rate and Rhythm: Normal rate.     Pulses: Normal pulses.  Pulmonary:     Effort: Pulmonary effort is normal.  Musculoskeletal:     Comments: Patient has tenderness to palpation over the distal clavicular region.  He also has some edema over the proximal humerus.   Skin:    General: Skin is warm.  Neurological:     Mental Status: He is alert and oriented to person, place, and time.     Sensory: No sensory deficit.     Motor: No weakness.      ED Treatments / Results  Labs (all labs ordered are listed, but only abnormal results are displayed) Labs Reviewed - No data to display  EKG None  Radiology Dg Clavicle Left  Result Date:  04/25/2019 CLINICAL DATA:  Hockey injury.  Left shoulder pain EXAM: LEFT CLAVICLE - 2+ VIEWS COMPARISON:  None FINDINGS: There is widening of the left AC joint with superior location of the distal clavicle relative to the acromion compatible with [facility] joint separation. No fracture. Glenohumeral joint is intact. IMPRESSION: Left AC joint separation. Electronically Signed   By: Rolm Baptise M.D.   On: 04/25/2019 20:14   Dg Shoulder Left  Result Date: 04/25/2019 CLINICAL DATA:  Hockey injury, shoulder pain EXAM: LEFT SHOULDER - 2+ VIEW COMPARISON:  Left clavicle series today FINDINGS: There is widening of the left AC joint with superior displacement of the clavicle relative to the acromion compatible with AC joint separation. Glenohumeral joint is intact. No visible fracture. IMPRESSION: Left AC joint separation. Electronically Signed   By: Rolm Baptise M.D.   On: 04/25/2019 20:15    Procedures Procedures (including critical care time)  Medications Ordered in ED Medications - No data to display   Initial Impression / Assessment and Plan / ED Course  I have reviewed the triage vital signs and the nursing notes.  Pertinent labs & imaging results that were available during my care of the patient were reviewed by me and considered in my medical decision making (see chart for details).        Patient comes in a chief complaint of shoulder pain. He sustained injury while playing hockey.  Essentially he ended up with blunt trauma to his shoulder, and the x-rays showing AC separation.  Differential diagnosis included partial dislocation, subluxation, distal clavicular fracture.  He is neurovascularly intact.  We will advise him to follow-up with sports medicine/orthopedic clinic within 5 to 7 days for what appears to be moderate to severe AC separation.  Final Clinical Impressions(s) / ED Diagnoses   Final diagnoses:  AC separation, left, initial encounter    ED Discharge Orders         Ordered     HYDROcodone-acetaminophen (NORCO/VICODIN) 5-325 MG tablet  Every 6 hours PRN     04/25/19 2103    ibuprofen (ADVIL) 600 MG tablet  Every 6 hours PRN     04/25/19 2103               Varney Biles, MD 04/26/19 0040

## 2022-08-08 ENCOUNTER — Other Ambulatory Visit: Payer: Self-pay

## 2022-08-08 ENCOUNTER — Ambulatory Visit
Admission: RE | Admit: 2022-08-08 | Discharge: 2022-08-08 | Disposition: A | Discharge status: Home / Self Care | Payer: BC Managed Care – PPO | Source: Ambulatory Visit

## 2022-08-08 ENCOUNTER — Ambulatory Visit (INDEPENDENT_AMBULATORY_CARE_PROVIDER_SITE_OTHER): Payer: BC Managed Care – PPO

## 2022-08-08 VITALS — BP 142/93 | HR 82 | Temp 98.0°F | Resp 18

## 2022-08-08 DIAGNOSIS — M546 Pain in thoracic spine: Secondary | ICD-10-CM

## 2022-08-08 DIAGNOSIS — R079 Chest pain, unspecified: Secondary | ICD-10-CM

## 2022-08-08 DIAGNOSIS — R0789 Other chest pain: Secondary | ICD-10-CM

## 2022-08-08 DIAGNOSIS — K224 Dyskinesia of esophagus: Secondary | ICD-10-CM | POA: Diagnosis not present

## 2022-08-08 MED ORDER — NIFEDIPINE 10 MG PO CAPS: 10.0000 mg | ORAL_CAPSULE | Freq: Three times a day (TID) | ORAL | 1 refills | Status: AC

## 2022-08-08 MED ORDER — LIDOCAINE VISCOUS HCL 2 % MT SOLN
15.0000 mL | Freq: Once | OROMUCOSAL | Status: AC
  Administered 2022-08-08: 15 mL via OROMUCOSAL

## 2022-08-08 MED ORDER — ALUM & MAG HYDROXIDE-SIMETH 200-200-20 MG/5ML PO SUSP
30.0000 mL | Freq: Once | ORAL | Status: AC
  Administered 2022-08-08: 30 mL via ORAL

## 2022-08-08 MED ORDER — FAMOTIDINE 20 MG PO TABS: 20.0000 mg | ORAL_TABLET | Freq: Two times a day (BID) | ORAL | 0 refills | Status: AC

## 2022-08-08 NOTE — ED Triage Notes (Signed)
Complains of back pain, between shoulder blades and center chest.  Onset last Friday, has been intermittent.  Reports swallowing cold liquid seems to make pain less intense. No nausea, no vomiting, no sob.  No known injury.

## 2022-08-08 NOTE — ED Provider Notes (Signed)
[facility]    CSN: [id number] Arrival date & time: 08/08/22  1347      History   Chief Complaint Chief Complaint  Patient presents with   Back Pain   Appointment    14:00    HPI Dave Carter is a 42 y.o. male.   42 year old male presents with chest pressure and discomfort.  Patient indicates for the past week he has been having intermittent but persistent chest pressure and discomfort in the center of the chest.  He relates that the discomfort travels through to the back between the shoulder blades.  Patient indicates that the pressure and discomfort last for several hours and tends to be relieved temporarily with drinking cold liquids.  He is not having any nausea, shortness of breath, sweating.  He indicates the pressure does not radiate but remains focused local.  There is no change in the discomfort with activity or at rest.  He indicates that the pressure and discomfort is not associated with meals.  He indicates he is not having chest pain.  Patient indicates that he has not had any trauma to the chest, has not injured himself lifting, has a normal appetite and fluid intake.  Patient denies any fever or chills.  He does not smoke.  Indicates he is having normal urinary output and bowel habits without any blood or change in color.   Back Pain   Past Medical History:  Diagnosis Date   Kidney stone     Patient Active Problem List   Diagnosis Date Noted   Foreign body in right foot 04/28/2018   Kidney stone 04/07/2017   Seasonal allergies 04/07/2017    Past Surgical History:  Procedure Laterality Date   HERNIA REPAIR         Home Medications    Prior to Admission medications   Medication Sig Start Date End Date Taking? Authorizing Provider  famotidine (PEPCID) 20 MG tablet Take 1 tablet (20 mg total) by mouth 2 (two) times daily. 08/08/22  Yes Nyoka Lint, PA-C  NIFEdipine (PROCARDIA) 10 MG capsule Take 1 capsule (10 mg total) by mouth 3 (three)  times daily. For esophageal spasm. 08/08/22  Yes Nyoka Lint, PA-C  acetaminophen (TYLENOL) 325 MG tablet Take 325 mg by mouth every 6 (six) hours as needed. For pain     [provider]  cetirizine (ZYRTEC) 10 MG tablet Take by mouth. Patient not taking: Reported on 08/08/2022    [provider]  HYDROcodone-acetaminophen (NORCO/VICODIN) 5-325 MG tablet Take 2 tablets by mouth every 6 (six) hours as needed for severe pain. Patient not taking: Reported on 08/08/2022 04/25/19   Varney Biles, MD  ibuprofen (ADVIL) 600 MG tablet Take 1 tablet (600 mg total) by mouth every 6 (six) hours as needed. 04/25/19   Varney Biles, MD  lisinopril (ZESTRIL) 10 MG tablet Take 10 mg by mouth daily.    [provider]  meloxicam (MOBIC) 15 MG tablet TAKE 1 TABLET BY MOUTH EVERY DAY Patient not taking: Reported on 08/08/2022 05/08/18   Trula Slade, DPM  metFORMIN (GLUCOPHAGE) 500 MG tablet  02/26/18   [provider]  metFORMIN (GLUCOPHAGE) 500 MG tablet Take by mouth. 08/21/18   [provider]  methylPREDNISolone (MEDROL DOSEPAK) 4 MG TBPK tablet Take as directed Patient not taking: Reported on 08/08/2022 04/07/17   Trula Slade, DPM  Multiple Vitamins-Minerals (MULTIVITAMINS THER. W/MINERALS) TABS Take 1 tablet by mouth daily.      [provider]  rosuvastatin (CRESTOR) 10 MG tablet Take by mouth. Patient not taking: Reported on 08/08/2022 04/08/19   [provider]    Family History Family History  Problem Relation Age of Onset   Asthma Mother    Diabetes Father    Coronary artery disease Father    Hemochromatosis Father     Social History Social History   Tobacco Use   Smoking status: Never   Smokeless tobacco: Never  Vaping Use   Vaping Use: Never used  Substance Use Topics   Alcohol use: Yes    Comment: socially   Drug use: No     Allergies   Patient has no known allergies.   Review of Systems Review of  Systems  Respiratory:  Positive for chest tightness (center of chest).   Musculoskeletal:  Positive for back pain.     Physical Exam Triage Vital Signs ED Triage Vitals  Enc Vitals Group     BP 08/08/22 1403 (!) 142/93     Pulse Rate 08/08/22 1403 82     Resp 08/08/22 1403 18     Temp 08/08/22 1403 98 F (36.7 C)     Temp src --      SpO2 08/08/22 1403 98 %     Weight --      Height --      Head Circumference --      Peak Flow --      Pain Score 08/08/22 1359 3     Pain Loc --      Pain Edu? --      Excl. in [place]? --    No data found.  Updated Vital Signs BP (!) 142/93 (BP Location: Left Arm)   Pulse 82   Temp 98 F (36.7 C)   Resp 18   SpO2 98%   Visual Acuity Right Eye Distance:   Left Eye Distance:   Bilateral Distance:    Right Eye Near:   Left Eye Near:    Bilateral Near:     Physical Exam Constitutional:      Appearance: Normal appearance.  HENT:     Right Ear: Tympanic membrane and ear canal normal.     Left Ear: Tympanic membrane and ear canal normal.     Mouth/Throat:     Mouth: Mucous membranes are moist.     Pharynx: Oropharynx is clear.  Cardiovascular:     Rate and Rhythm: Normal rate and regular rhythm.     Heart sounds: Normal heart sounds.  Pulmonary:     Effort: Pulmonary effort is normal.     Breath sounds: Normal breath sounds and air entry. No wheezing, rhonchi or rales.  Abdominal:     General: Abdomen is flat. Bowel sounds are normal.     Palpations: Abdomen is soft.     Tenderness: There is no abdominal tenderness. There is no guarding or rebound.  Lymphadenopathy:     Cervical: No cervical adenopathy.  Neurological:     Mental Status: He is alert.      UC Treatments / Results  Labs (all labs ordered are listed, but only abnormal results are displayed) Labs Reviewed - No data to display  EKG: Normal sinus rhythm with flipped T waves V2,3,4.   Radiology DG Chest 2 View  Result Date: 08/08/2022 CLINICAL DATA:  Chest  pain EXAM: CHEST - 2 VIEW COMPARISON:  04/16/2007 FINDINGS: The heart size and mediastinal contours are within normal limits. Both lungs are clear. The visualized  skeletal structures are unremarkable. IMPRESSION: No active cardiopulmonary disease. Electronically Signed   By: Jerilynn Mages.  Shick M.D.   On: 08/08/2022 14:34    Procedures Procedures (including critical care time)  Medications Ordered in UC Medications  alum & mag hydroxide-simeth (MAALOX/MYLANTA) 200-200-20 MG/5ML suspension 30 mL (30 mLs Oral Given 08/08/22 1440)  lidocaine (XYLOCAINE) 2 % viscous mouth solution 15 mL (15 mLs Mouth/Throat Given 08/08/22 1438)    Initial Impression / Assessment and Plan / UC Course  I have reviewed the triage vital signs and the nursing notes.  Pertinent labs & imaging results that were available during my care of the patient were reviewed by me and considered in my medical decision making (see chart for details).    Plan: Will be treated with the following: 1.  Chest pain: A.  Pepcid 20 mg twice daily to see if this will help relieve symptoms. 2.  Esophageal spasm: A.  Procardia 56m, 3 times a day for the next 10 days to see if this helps relieve symptoms. 3.  Patient advised follow-up PCP or report to emergency room if symptoms increase and are followed with pain, shortness of breath, nausea. Final Clinical Impressions(s) / UC Diagnoses   Final diagnoses:  Other chest pain  Esophageal spasm  Acute midline thoracic back pain     Discharge Instructions      Advised to take the Procardia 10 mg 3 times a day as this will help decrease esophageal spasm and irritability.  Advised take the Pepcid 20 mg twice daily until completed to see if this will also help decrease any esophageal irritability.  Advised to follow-up PCP or return to urgent care as needed.    ED Prescriptions     Medication Sig Dispense Auth. Provider   NIFEdipine (PROCARDIA) 10 MG capsule Take 1 capsule (10 mg total) by  mouth 3 (three) times daily. For esophageal spasm. 30 capsule JNyoka Lint PA-C   famotidine (PEPCID) 20 MG tablet Take 1 tablet (20 mg total) by mouth 2 (two) times daily. 30 tablet JNyoka Lint PA-C      PDMP not reviewed this encounter.   JNyoka Lint PA-C 08/08/22 1502

## 2022-08-08 NOTE — Discharge Instructions (Signed)
Advised to take the Procardia 10 mg 3 times a day as this will help decrease esophageal spasm and irritability.  Advised take the Pepcid 20 mg twice daily until completed to see if this will also help decrease any esophageal irritability.  Advised to follow-up PCP or return to urgent care as needed.

## 2022-09-01 ENCOUNTER — Encounter (HOSPITAL_BASED_OUTPATIENT_CLINIC_OR_DEPARTMENT_OTHER): Payer: Self-pay

## 2022-09-01 ENCOUNTER — Other Ambulatory Visit: Payer: Self-pay

## 2022-09-01 ENCOUNTER — Emergency Department (HOSPITAL_BASED_OUTPATIENT_CLINIC_OR_DEPARTMENT_OTHER): Payer: BC Managed Care – PPO

## 2022-09-01 ENCOUNTER — Emergency Department (HOSPITAL_BASED_OUTPATIENT_CLINIC_OR_DEPARTMENT_OTHER)
Admission: EM | Admit: 2022-09-01 | Discharge: 2022-09-02 | Disposition: A | Discharge status: Home / Self Care | Payer: BC Managed Care – PPO | Attending: Emergency Medicine | Admitting: Emergency Medicine

## 2022-09-01 DIAGNOSIS — R202 Paresthesia of skin: Secondary | ICD-10-CM | POA: Insufficient documentation

## 2022-09-01 DIAGNOSIS — J45909 Unspecified asthma, uncomplicated: Secondary | ICD-10-CM | POA: Insufficient documentation

## 2022-09-01 DIAGNOSIS — R200 Anesthesia of skin: Secondary | ICD-10-CM

## 2022-09-01 DIAGNOSIS — R29810 Facial weakness: Secondary | ICD-10-CM | POA: Diagnosis not present

## 2022-09-01 DIAGNOSIS — I10 Essential (primary) hypertension: Secondary | ICD-10-CM | POA: Diagnosis not present

## 2022-09-01 DIAGNOSIS — Z79899 Other long term (current) drug therapy: Secondary | ICD-10-CM | POA: Insufficient documentation

## 2022-09-01 DIAGNOSIS — E119 Type 2 diabetes mellitus without complications: Secondary | ICD-10-CM | POA: Diagnosis not present

## 2022-09-01 DIAGNOSIS — Z7984 Long term (current) use of oral hypoglycemic drugs: Secondary | ICD-10-CM | POA: Insufficient documentation

## 2022-09-01 LAB — CBC
HCT: 42.7 % (ref 39.0–52.0)
Hemoglobin: 14.9 g/dL (ref 13.0–17.0)
MCH: 29.6 pg (ref 26.0–34.0)
MCHC: 34.9 g/dL (ref 30.0–36.0)
MCV: 84.7 fL (ref 80.0–100.0)
Platelets: 359 10*3/uL (ref 150–400)
RBC: 5.04 MIL/uL (ref 4.22–5.81)
RDW: 12.6 % (ref 11.5–15.5)
WBC: 13 10*3/uL — ABNORMAL HIGH (ref 4.0–10.5)
nRBC: 0 % (ref 0.0–0.2)

## 2022-09-01 LAB — COMPREHENSIVE METABOLIC PANEL
ALT: 36 U/L (ref 0–44)
AST: 24 U/L (ref 15–41)
Albumin: 4.7 g/dL (ref 3.5–5.0)
Alkaline Phosphatase: 88 U/L (ref 38–126)
Anion gap: 7 (ref 5–15)
BUN: 16 mg/dL (ref 6–20)
CO2: 24 mmol/L (ref 22–32)
Calcium: 9.1 mg/dL (ref 8.9–10.3)
Chloride: 106 mmol/L (ref 98–111)
Creatinine, Ser: 0.7 mg/dL (ref 0.61–1.24)
GFR, Estimated: >60 mL/min (ref >60)
Glucose, Bld: 144 mg/dL — ABNORMAL HIGH (ref 70–99)
Potassium: 3.9 mmol/L (ref 3.5–5.1)
Sodium: 137 mmol/L (ref 135–145)
Total Bilirubin: 0.9 mg/dL (ref 0.3–1.2)
Total Protein: 8 g/dL (ref 6.5–8.1)

## 2022-09-01 LAB — DIFFERENTIAL
Abs Immature Granulocytes: 0.04 10*3/uL (ref 0.00–0.07)
Basophils Absolute: 0.1 10*3/uL (ref 0.0–0.1)
Basophils Relative: 1 %
Eosinophils Absolute: 0.1 10*3/uL (ref 0.0–0.5)
Eosinophils Relative: 1 %
Immature Granulocytes: 0 %
Lymphocytes Relative: 12 %
Lymphs Abs: 1.6 10*3/uL (ref 0.7–4.0)
Monocytes Absolute: 0.7 10*3/uL (ref 0.1–1.0)
Monocytes Relative: 6 %
Neutro Abs: 10.5 10*3/uL — ABNORMAL HIGH (ref 1.7–7.7)
Neutrophils Relative %: 80 %

## 2022-09-01 LAB — ETHANOL: Alcohol, Ethyl (B): <10 mg/dL (ref <10)

## 2022-09-01 LAB — PROTIME-INR
INR: 0.9 (ref 0.8–1.2)
Prothrombin Time: 12.3 seconds (ref 11.4–15.2)

## 2022-09-01 LAB — APTT: aPTT: 22 seconds — ABNORMAL LOW (ref 24–36)

## 2022-09-01 LAB — CBG MONITORING, ED: Glucose-Capillary: 121 mg/dL — ABNORMAL HIGH (ref 70–99)

## 2022-09-01 MED ORDER — TENECTEPLASE FOR STROKE
PACK | INTRAVENOUS | Status: AC
  Filled 2022-09-01: qty 10

## 2022-09-01 MED ORDER — ASPIRIN 325 MG PO TBEC
325.0000 mg | DELAYED_RELEASE_TABLET | Freq: Once | ORAL | Status: AC
  Administered 2022-09-02: 325 mg via ORAL
  Filled 2022-09-01: qty 1

## 2022-09-01 NOTE — ED Provider Notes (Signed)
EMERGENCY DEPARTMENT AT [facility] Provider Note   CSN: [id number] Arrival date & time: 09/01/22  2223     History  Chief Complaint  Patient presents with   Code Stroke    Dave Carter is a 42 y.o. male.  HPI   42 year old male presents emergency department with concern for numbness to the right side of the face and associated facial droop.  Patient admits that about 4 hours ago he was playing hockey with his friends, he did have a frontal collision with another player, was wearing his helmet.  No loss of consciousness, no active headache.  About 45 minutes after that when he got in his car to drive home his heart felt weird.  This progressed to numbness involving the right side of the face and scalp.  The onset was 3-1/2 hours ago acutely.  There is also been concern for right-sided facial droop.  Patient placed as a code stroke, other history limited secondary to acuity.  He is not on any blood thinning medication.  No vision change or other focal weakness/numbness.  Home Medications Prior to Admission medications   Medication Sig Start Date End Date Taking? Authorizing Provider  acetaminophen (TYLENOL) 325 MG tablet Take 325 mg by mouth every 6 (six) hours as needed. For pain     [provider]  cetirizine (ZYRTEC) 10 MG tablet Take by mouth. Patient not taking: Reported on 08/08/2022    [provider]  famotidine (PEPCID) 20 MG tablet Take 1 tablet (20 mg total) by mouth 2 (two) times daily. 08/08/22   Nyoka Lint, PA-C  HYDROcodone-acetaminophen (NORCO/VICODIN) 5-325 MG tablet Take 2 tablets by mouth every 6 (six) hours as needed for severe pain. Patient not taking: Reported on 08/08/2022 04/25/19   Varney Biles, MD  ibuprofen (ADVIL) 600 MG tablet Take 1 tablet (600 mg total) by mouth every 6 (six) hours as needed. 04/25/19   Varney Biles, MD  lisinopril (ZESTRIL) 10 MG tablet Take 10 mg by mouth daily.    [provider]   meloxicam (MOBIC) 15 MG tablet TAKE 1 TABLET BY MOUTH EVERY DAY Patient not taking: Reported on 08/08/2022 05/08/18   Trula Slade, DPM  metFORMIN (GLUCOPHAGE) 500 MG tablet  02/26/18   [provider]  metFORMIN (GLUCOPHAGE) 500 MG tablet Take by mouth. 08/21/18   [provider]  methylPREDNISolone (MEDROL DOSEPAK) 4 MG TBPK tablet Take as directed Patient not taking: Reported on 08/08/2022 04/07/17   Trula Slade, DPM  Multiple Vitamins-Minerals (MULTIVITAMINS THER. W/MINERALS) TABS Take 1 tablet by mouth daily.      [provider]  NIFEdipine (PROCARDIA) 10 MG capsule Take 1 capsule (10 mg total) by mouth 3 (three) times daily. For esophageal spasm. 08/08/22   Nyoka Lint, PA-C  rosuvastatin (CRESTOR) 10 MG tablet Take by mouth. Patient not taking: Reported on 08/08/2022 04/08/19   [provider]      Allergies    Patient has no known allergies.    Review of Systems   Review of Systems  Constitutional:  Negative for fever.  Eyes:  Negative for visual disturbance.  Respiratory:  Negative for shortness of breath.   Cardiovascular:  Negative for chest pain.  Gastrointestinal:  Negative for abdominal pain, diarrhea and vomiting.  Skin:  Negative for rash.  Neurological:  Positive for facial asymmetry and numbness. Negative for speech difficulty, weakness and headaches.    Physical Exam Updated Vital Signs BP (!) 139/90 (BP  Location: Left Arm)   Pulse (!) 101   Temp 97.8 F (36.6 C)   Resp 18   Wt 77.6 kg   SpO2 100%   BMI 24.55 kg/m  Physical Exam Vitals and nursing note reviewed.  Constitutional:      Appearance: Normal appearance.  HENT:     Head: Normocephalic.     Mouth/Throat:     Mouth: Mucous membranes are moist.  Cardiovascular:     Rate and Rhythm: Normal rate.  Pulmonary:     Effort: Pulmonary effort is normal. No respiratory distress.  Abdominal:     Palpations: Abdomen is soft.     Tenderness: There is no  abdominal tenderness.  Skin:    General: Skin is warm.  Neurological:     Mental Status: He is alert and oriented to person, place, and time.     Comments: Drooping of the corner of the right mouth, smile is asymmetric with drooping of the right side.  Decrease sensation in the right cheek and temple area on exam. Symmetric forehead.  Equal grip strength, no pronator drift.  Psychiatric:        Mood and Affect: Mood normal.     ED Results / Procedures / Treatments   Labs (all labs ordered are listed, but only abnormal results are displayed) Labs Reviewed  CBC - Abnormal; Notable for the following components:      Result Value   WBC 13.0 (*)    All other components within normal limits  DIFFERENTIAL - Abnormal; Notable for the following components:   Neutro Abs 10.5 (*)    All other components within normal limits  CBG MONITORING, ED - Abnormal; Notable for the following components:   Glucose-Capillary 121 (*)    All other components within normal limits  ETHANOL  PROTIME-INR  APTT  COMPREHENSIVE METABOLIC PANEL  RAPID URINE DRUG SCREEN, HOSP PERFORMED  URINALYSIS, ROUTINE W REFLEX MICROSCOPIC    EKG None  Radiology No results found.  Procedures .Critical Care  Performed by: Lorelle Gibbs, DO Authorized by: Lorelle Gibbs, DO   Critical care provider statement:    Critical care time (minutes):  30   Critical care time was exclusive of:  Separately billable procedures and treating other patients   Critical care was necessary to treat or prevent imminent or life-threatening deterioration of the following conditions:  CNS failure or compromise   Critical care was time spent personally by me on the following activities:  Development of treatment plan with patient or surrogate, discussions with consultants, evaluation of patient's response to treatment, examination of patient, ordering and review of laboratory studies, ordering and review of radiographic studies,  ordering and performing treatments and interventions, pulse oximetry, re-evaluation of patient's condition and review of old charts   I assumed direction of critical care for this patient from another provider in my specialty: no       Medications Ordered in ED Medications  tenecteplase (TNKASE) 50 MG injection for Stroke (has no administration in time range)    ED Course/ Medical Decision Making/ A&P                             Medical Decision Making Amount and/or Complexity of Data Reviewed Labs: ordered. Radiology: ordered.   42 year old male presents emergency department with acute onset right-sided facial numbness and droop.  We face timed with the patient's wife in the triage room and she confirms  that the right-sided facial droop is new and that his face is asymmetric.  Symptoms ongoing for 3-1/2 hours.  Patient denies any headache, vision change, speech changes or other focal neurocomplaints.  He does have noticeable drooping of the right corner of the mouth with decreased sensation on exam.  Otherwise he appears neurologically intact.  Code stroke placed.  CT of the head without is unremarkable.  Teleneurology evaluated the patient, recommends further evaluation with an MRI of the brain with and without contrast to evaluate for stroke and other etiologies given his young age.  He still has ongoing noticeable right mouth droop. Not a tpa candidate.  Patient will be transferred to the ER for MRI evaluation.  Dr. Christy Gentles accepting..        Final Clinical Impression(s) / ED Diagnoses Final diagnoses:  None    Rx / DC Orders ED Discharge Orders     None         Lorelle Gibbs, DO 09/01/22 2342

## 2022-09-01 NOTE — ED Notes (Signed)
Pt on portable monitor and taken to CT with this RN.

## 2022-09-01 NOTE — ED Triage Notes (Addendum)
Pt arrives with c/o right sided head numbness/tingling that started around 19:15. Pt does have right sided facial droop in triage and spouse verified that was abnormal for pt. Pt denies focal weakness or slurred speech. Per pt, he did have an episode of double vision for a 1 minute. Pt did have full body contact during a hockey game before symptoms started. Pt a&ox4.

## 2022-09-01 NOTE — ED Notes (Signed)
Pt with LLE numbness at baseline. Has seen PCP for this issue and has been referred to a neurologist for nerve testing. Pt also with baseline numbness/burning/tingling/itching to the web of hands between first digits and thumbs in LT and RT hand. "Been there for months".

## 2022-09-01 NOTE — ED Notes (Signed)
Pt back in room from CT. Teleneurology stand in front of pt at this time.

## 2022-09-01 NOTE — Consult Note (Addendum)
Pt with R side numbness and tingling. Experienced double vision x 1 minute after full body contact during [employer]. Arrived alert and oriented x 4. Pt with hx of parasthesias. MRS 0 Code Stroke activated 2245 Teleneuro paged 2253 Pt back from [place] Called telespecialist at 2258 Neuro on call at 1111 Neuro assessing patient at 1114.  NCCT results given to Telespecialist on call.  TSP decision not to give TNK 2320, Recommends MRI

## 2022-09-01 NOTE — ED Notes (Signed)
Per MD Rockney Ghee, she is giving NIH stroke scale of 1 for facial droop on RT side. No TNK at this time.

## 2022-09-01 NOTE — ED Notes (Signed)
Code stroke called at 2244

## 2022-09-01 NOTE — Consult Note (Signed)
[facility] Tele[facility] Consult [facility]   Patient Name:   Dave Carter, Dave Carter Date of Birth:   [birth year] Identification Number:   MRN - [id number] Date of Service:   09/01/2022 22:53:54  Diagnosis:       R20.2 - Paresthesia of skin  Impression:      42 yo RH M with h/o HTN, diabetes, asthma, HLD, presenting with transient diplopia and R head/facial paresthesias. On exam he has mild R facial weakness which his wife said is new. He's also had LLE paresthesias being seen by neurology as outpatient with plans for peripheral neuropathy workup. Recommend stroke rule-out given his vascular risk factors with sudden unilateral symptoms tonight. Not a thrombolytic candidate due to nondisabling symptoms, discussed with patient who understands and agrees. Not a thrombectomy candidate with low stroke scale and no LVO syndrome. If stroke ruled out, ok to discharge to follow up with his outpatient neurologist. Case discussed with ED Dr. Dina Rich by phone.  Our recommendations are outlined below.  Recommendations:        Stroke/Telemetry Floor       Neuro Checks       Bedside Swallow Eval       DVT Prophylaxis       IV Fluids, Normal Saline       Head of Bed 30 Degrees       Euglycemia and Avoid Hyperthermia (PRN Acetaminophen)       Initiate or continue Aspirin 325 MG daily       Antihypertensives PRN if Blood pressure is greater than 220/120 or there is a concern for End organ damage/contraindications for permissive HTN. If blood pressure is greater than 220/120 give labetalol PO or IV or Vasotec IV with a goal of 15% reduction in BP during the first 24 hours.  Sign Out:       Discussed with Emergency Department Provider    ------------------------------------------------------------------------------  Advanced Imaging: Advanced Imaging Deferred because:  Non-disabling symptoms as verified by the patient; no cortical signs so not consistent with LVO   Metrics: Last Known  Well: 09/01/2022 19:15:00 [facility] Notification Time: 09/01/2022 22:53:54 Arrival Time: 09/01/2022 22:23:00 Stamp Time: 09/01/2022 22:53:54 Initial Response Time: 09/01/2022 23:09:56 Symptoms: transient diplopia and R head/facial paresthesias. Initial patient interaction: 09/01/2022 23:14:49 NIHSS Assessment Completed: 09/01/2022 23:20:21 Patient is not a candidate for Thrombolytic. Thrombolytic Medical Decision: 09/01/2022 23:20:21 Patient was not deemed candidate for Thrombolytic because of following reasons: No disabling symptoms.  CT head showed no acute hemorrhage or acute core infarct. I personally Reviewed the CT Head and it Showed No obvious new stroke or bleed  Primary Provider Notified of Diagnostic Impression and Management Plan on: 09/01/2022 23:29:40    ------------------------------------------------------------------------------  History of Present Illness: Patient is a 42 year old Male.  Patient was brought by private transportation with symptoms of transient diplopia and R head/facial paresthesias. 42 yo RH M with h/o HTN, diabetes, asthma, HLD, presenting with transient diplopia and R head/facial paresthesias.  LKN 1915. He was playing in a hockey game and had full body contact with another player. He did not hit his head. Just got up and skated off without issues. 10 minutes later he took his helmet off and had transient double vision that lasted a couple of seconds and then went away. Not clearly horizontal or vertical diplopia. On the drive home, his baseball cap started feeling funny on the R side of his head like it was tingling and itching. He had a slight headache for a few seconds  on the R side and it went away. The R head/facial paresthesias have come and gone a little since yesterday.  He's had some baseline numbness of the LLE and webs of both hands between the thumb and index finger, for which he saw a neurologist a couple of weeks ago. EMG/NCS is  ordered and to be done next month.  He had chest pain a couple of weeks ago. Saw his PCP last week who did an EKG which was abnormal so referred to a cardiologist.    Past Medical History:      Hypertension      Diabetes Mellitus      Hyperlipidemia [place] PMH:  asthma  Medications:  No Anticoagulant use  No Antiplatelet use Reviewed EMR for current medications  Allergies:  Reviewed,NKDA  Social History: Smoking: No  Family History:  There is no family history of premature cerebrovascular disease pertinent to this consultation  ROS : 14 Points Review of Systems was performed and was negative except mentioned in HPI.  Past Surgical History: There Is No Surgical History Contributory To Today's Visit     Examination: BP(131/90), Pulse(101), Blood Glucose(121) 1A: Level of Consciousness - Alert; keenly responsive + 0 1B: Ask Month and Age - Both Questions Right + 0 1C: Blink Eyes & Squeeze Hands - Performs Both Tasks + 0 2: Test Horizontal Extraocular Movements - Normal + 0 3: Test Visual Fields - No Visual Loss + 0 4: Test Facial Palsy (Use Grimace if Obtunded) - Minor paralysis (flat nasolabial fold, smile asymmetry) + 1 5A: Test Left Arm Motor Drift - No Drift for 10 Seconds + 0 5B: Test Right Arm Motor Drift - No Drift for 10 Seconds + 0 6A: Test Left Leg Motor Drift - No Drift for 5 Seconds + 0 6B: Test Right Leg Motor Drift - No Drift for 5 Seconds + 0 7: Test Limb Ataxia (FNF/Heel-Shin) - No Ataxia + 0 8: Test Sensation - Normal; No sensory loss + 0 9: Test Language/Aphasia - Normal; No aphasia + 0 10: Test Dysarthria - Normal + 0 11: Test Extinction/Inattention - No abnormality + 0  NIHSS Score: 1  NIHSS Free Text : Mild R facial weakness (he doesn't notice anything different but wife felt it was different)  Pre-Morbid Modified Rankin Scale: 0 Points = No symptoms at all  Spoke with : Dr. Dina Rich  Patient/Family was informed the Neurology Consult would  occur via TeleHealth consult by way of interactive audio and video telecommunications and consented to receiving care in this manner.   Patient is being evaluated for possible acute neurologic impairment and high probability of imminent or life-threatening deterioration. I spent total of 40 minutes providing care to this patient, including time for face to face visit via telemedicine, review of medical records, imaging studies and discussion of findings with providers, the patient and/or family.   Dr Raymond Gurney   [facility] For Inpatient follow-up with [facility] physician please call [facility] [phone]. This is not an outpatient service. Post hospital discharge, please contact hospital directly.  Please do not communicate with [facility] physicians via secure chat. If you have any questions, Please contact [facility]. Please call or reconsult our service if there are any clinical or diagnostic changes.

## 2022-09-02 ENCOUNTER — Emergency Department (HOSPITAL_COMMUNITY): Payer: BC Managed Care – PPO

## 2022-09-02 DIAGNOSIS — R29810 Facial weakness: Secondary | ICD-10-CM | POA: Diagnosis not present

## 2022-09-02 DIAGNOSIS — Z7984 Long term (current) use of oral hypoglycemic drugs: Secondary | ICD-10-CM | POA: Diagnosis not present

## 2022-09-02 DIAGNOSIS — J45909 Unspecified asthma, uncomplicated: Secondary | ICD-10-CM | POA: Diagnosis not present

## 2022-09-02 DIAGNOSIS — I10 Essential (primary) hypertension: Secondary | ICD-10-CM | POA: Diagnosis not present

## 2022-09-02 DIAGNOSIS — E119 Type 2 diabetes mellitus without complications: Secondary | ICD-10-CM | POA: Diagnosis not present

## 2022-09-02 DIAGNOSIS — R202 Paresthesia of skin: Secondary | ICD-10-CM | POA: Diagnosis not present

## 2022-09-02 DIAGNOSIS — Z79899 Other long term (current) drug therapy: Secondary | ICD-10-CM | POA: Diagnosis not present

## 2022-09-02 DIAGNOSIS — R200 Anesthesia of skin: Secondary | ICD-10-CM | POA: Diagnosis present

## 2022-09-02 LAB — URINALYSIS, ROUTINE W REFLEX MICROSCOPIC
Bilirubin Urine: NEGATIVE
Glucose, UA: 100 mg/dL — AB
Hgb urine dipstick: NEGATIVE
Ketones, ur: NEGATIVE mg/dL
Leukocytes,Ua: NEGATIVE
Nitrite: NEGATIVE
Protein, ur: NEGATIVE mg/dL
Specific Gravity, Urine: <=1.005 (ref 1.005–1.030)
pH: 5 (ref 5.0–8.0)

## 2022-09-02 LAB — RAPID URINE DRUG SCREEN, HOSP PERFORMED
Amphetamines: NOT DETECTED
Barbiturates: NOT DETECTED
Benzodiazepines: NOT DETECTED
Cocaine: NOT DETECTED
Opiates: NOT DETECTED
Tetrahydrocannabinol: NOT DETECTED

## 2022-09-02 MED ORDER — GADOBUTROL 1 MMOL/ML IV SOLN
7.5000 mL | Freq: Once | INTRAVENOUS | Status: AC | PRN
  Administered 2022-09-02: 7.5 mL via INTRAVENOUS

## 2022-09-02 NOTE — ED Notes (Signed)
Pt OOB to use the bathroom. Pt denies any weakness or dizziness at this time. Ambulating with even and steady gait.

## 2022-09-02 NOTE — ED Notes (Signed)
Patient transported to MRI 

## 2022-09-02 NOTE — ED Notes (Signed)
[facility] (Lauren) called for transport 01:33a

## 2022-09-02 NOTE — Discharge Instructions (Signed)
MRI today was normal. Please call your neurologist today to arrange some close follow-up. Return here for new concerns.

## 2022-09-02 NOTE — ED Provider Notes (Signed)
Arrives from [facility] for MRI.  States numbness feels improved since time of arrival there.  No new changes.  Not claustrophobic.  Will notify MRI he is here and ready.  Results for orders placed or performed during the hospital encounter of 09/01/22  Ethanol  Result Value Ref Range   Alcohol, Ethyl (B) <10 <10 mg/dL  Protime-INR  Result Value Ref Range   Prothrombin Time 12.3 11.4 - 15.2 seconds   INR 0.9 0.8 - 1.2  APTT  Result Value Ref Range   aPTT 22 (L) 24 - 36 seconds  CBC  Result Value Ref Range   WBC 13.0 (H) 4.0 - 10.5 K/uL   RBC 5.04 4.22 - 5.81 MIL/uL   Hemoglobin 14.9 13.0 - 17.0 g/dL   HCT 42.7 39.0 - 52.0 %   MCV 84.7 80.0 - 100.0 fL   MCH 29.6 26.0 - 34.0 pg   MCHC 34.9 30.0 - 36.0 g/dL   RDW 12.6 11.5 - 15.5 %   Platelets 359 150 - 400 K/uL   nRBC 0.0 0.0 - 0.2 %  Differential  Result Value Ref Range   Neutrophils Relative % 80 %   Neutro Abs 10.5 (H) 1.7 - 7.7 K/uL   Lymphocytes Relative 12 %   Lymphs Abs 1.6 0.7 - 4.0 K/uL   Monocytes Relative 6 %   Monocytes Absolute 0.7 0.1 - 1.0 K/uL   Eosinophils Relative 1 %   Eosinophils Absolute 0.1 0.0 - 0.5 K/uL   Basophils Relative 1 %   Basophils Absolute 0.1 0.0 - 0.1 K/uL   Immature Granulocytes 0 %   Abs Immature Granulocytes 0.04 0.00 - 0.07 K/uL  Comprehensive metabolic panel  Result Value Ref Range   Sodium 137 135 - 145 mmol/L   Potassium 3.9 3.5 - 5.1 mmol/L   Chloride 106 98 - 111 mmol/L   CO2 24 22 - 32 mmol/L   Glucose, Bld 144 (H) 70 - 99 mg/dL   BUN 16 6 - 20 mg/dL   Creatinine, Ser 0.70 0.61 - 1.24 mg/dL   Calcium 9.1 8.9 - 10.3 mg/dL   Total Protein 8.0 6.5 - 8.1 g/dL   Albumin 4.7 3.5 - 5.0 g/dL   AST 24 15 - 41 U/L   ALT 36 0 - 44 U/L   Alkaline Phosphatase 88 38 - 126 U/L   Total Bilirubin 0.9 0.3 - 1.2 mg/dL   GFR, Estimated >60 >60 mL/min   Anion gap 7 5 - 15  Urine rapid drug screen (hosp performed)  Result Value Ref Range   Opiates NONE DETECTED NONE DETECTED   Cocaine NONE  DETECTED NONE DETECTED   Benzodiazepines NONE DETECTED NONE DETECTED   Amphetamines NONE DETECTED NONE DETECTED   Tetrahydrocannabinol NONE DETECTED NONE DETECTED   Barbiturates NONE DETECTED NONE DETECTED  Urinalysis, Routine w reflex microscopic -Urine, Clean Catch  Result Value Ref Range   Color, Urine YELLOW YELLOW   APPearance CLEAR CLEAR   Specific Gravity, Urine <=1.005 1.005 - 1.030   pH 5.0 5.0 - 8.0   Glucose, UA 100 (A) NEGATIVE mg/dL   Hgb urine dipstick NEGATIVE NEGATIVE   Bilirubin Urine NEGATIVE NEGATIVE   Ketones, ur NEGATIVE NEGATIVE mg/dL   Protein, ur NEGATIVE NEGATIVE mg/dL   Nitrite NEGATIVE NEGATIVE   Leukocytes,Ua NEGATIVE NEGATIVE  CBG monitoring, ED  Result Value Ref Range   Glucose-Capillary 121 (H) 70 - 99 mg/dL   MR Brain W and Wo Contrast  Result Date: 09/02/2022 CLINICAL  DATA:  42 year old male code stroke presentation last night. EXAM: MRI HEAD WITHOUT AND WITH CONTRAST TECHNIQUE: Multiplanar, multiecho pulse sequences of the brain and surrounding structures were obtained without and with intravenous contrast. CONTRAST:  7.71m GADAVIST GADOBUTROL 1 MMOL/ML IV SOLN COMPARISON:  Head CT yesterday. FINDINGS: Brain: Normal cerebral volume. No restricted diffusion to suggest acute infarction. No midline shift, mass effect, evidence of mass lesion, ventriculomegaly, extra-axial collection or acute intracranial hemorrhage. Cervicomedullary junction and pituitary are within normal limits. GPearline Cablesand white matter signal appears normal throughout the brain. No convincing encephalomalacia. No chronic cerebral blood products. Deep gray nuclei, brainstem and cerebellum appear negative. No abnormal enhancement identified. No dural thickening. Vascular: Major intracranial vascular flow voids are preserved. Following contrast the major dural venous sinuses are enhancing and appear to be patent. Skull and upper cervical spine: Normal visible cervical spine and bone marrow signal.  Sinuses/Orbits: Orbits appear negative. Scattered bilateral paranasal sinus mucosal thickening and nodular mucous retention cysts. Nasal cavity has a more normal appearance. No definite sinus fluid levels. Other: Mastoids are well aerated. Visible internal auditory structures appear normal. Negative visible scalp and face. IMPRESSION: 1. Normal MRI appearance of the Brain. 2. Some bilateral paranasal sinus disease with multiple mucous retention cysts, but probably not rising to the level of sinonasal polyposis. Electronically Signed   By: HGenevie AnnM.D.   On: 09/02/2022 06:04   CT HEAD CODE STROKE WO CONTRAST  Result Date: 09/01/2022 CLINICAL DATA:  Code stroke.  Right facial numbness EXAM: CT HEAD WITHOUT CONTRAST TECHNIQUE: Contiguous axial images were obtained from the base of the skull through the vertex without intravenous contrast. RADIATION DOSE REDUCTION: This exam was performed according to the departmental dose-optimization program which includes automated exposure control, adjustment of the mA and/or kV according to patient size and/or use of iterative reconstruction technique. COMPARISON:  None Available. FINDINGS: Brain: There is no mass, hemorrhage or extra-axial collection. The size and configuration of the ventricles and extra-axial CSF spaces are normal. The brain parenchyma is normal, without evidence of acute or chronic infarction. Vascular: No abnormal hyperdensity of the major intracranial arteries or dural venous sinuses. No intracranial atherosclerosis. Skull: The visualized skull base, calvarium and extracranial soft tissues are normal. Sinuses/Orbits: No fluid levels or advanced mucosal thickening of the visualized paranasal sinuses. No mastoid or middle ear effusion. The orbits are normal. ASPECTS ([facility] Score) - Ganglionic level infarction (caudate, lentiform nuclei, internal capsule, insula, M1-M3 cortex): 7 - Supraganglionic infarction (M4-M6 cortex): 3 Total  score (0-10 with 10 being normal): 10 IMPRESSION: 1. Normal head CT. 2. ASPECTS is 10. These results were called by telephone at the time of interpretation on 09/01/2022 at 11:07 pm to provider [facility], who verbally acknowledged these results. Electronically Signed   By: KUlyses JarredM.D.   On: 09/01/2022 23:07   DG Chest 2 View  Result Date: 08/08/2022 CLINICAL DATA:  Chest pain EXAM: CHEST - 2 VIEW COMPARISON:  04/16/2007 FINDINGS: The heart size and mediastinal contours are within normal limits. Both lungs are clear. The visualized skeletal structures are unremarkable. IMPRESSION: No active cardiopulmonary disease. Electronically Signed   By: MJerilynn Mages  Shick M.D.   On: 08/08/2022 14:34    MRI is negative.  Patient feeling well.  VSS.  Per tele-neurology, ok for discharge.  [facility]  He will call this morning to arrange close follow-up.  Given copies of labs/imaging results for  physician review.  Can return here for any new/acute changes.   Larene Pickett, PA-C 09/02/22 [id number]    Ripley Fraise, MD 09/02/22 [phone]

## 2023-08-05 NOTE — Progress Notes (Signed)
Marland Kitchen  POV

## 2023-09-27 ENCOUNTER — Emergency Department (HOSPITAL_COMMUNITY)
Admission: EM | Admit: 2023-09-27 | Discharge: 2023-09-28 | Disposition: A | Discharge status: Home / Self Care | Attending: Emergency Medicine | Admitting: Emergency Medicine

## 2023-09-27 DIAGNOSIS — R079 Chest pain, unspecified: Secondary | ICD-10-CM

## 2023-09-27 DIAGNOSIS — R0789 Other chest pain: Secondary | ICD-10-CM | POA: Diagnosis present

## 2023-09-28 ENCOUNTER — Other Ambulatory Visit: Payer: Self-pay

## 2023-09-28 ENCOUNTER — Emergency Department (HOSPITAL_COMMUNITY)

## 2023-09-28 ENCOUNTER — Encounter (HOSPITAL_COMMUNITY): Payer: Self-pay | Admitting: *Deleted

## 2023-09-28 LAB — BASIC METABOLIC PANEL WITH GFR
Anion gap: 12 (ref 5–15)
BUN: 12 mg/dL (ref 6–20)
CO2: 20 mmol/L — ABNORMAL LOW (ref 22–32)
Calcium: 8.7 mg/dL — ABNORMAL LOW (ref 8.9–10.3)
Chloride: 104 mmol/L (ref 98–111)
Creatinine, Ser: 0.78 mg/dL (ref 0.61–1.24)
GFR, Estimated: >60 mL/min (ref >60)
Glucose, Bld: 312 mg/dL — ABNORMAL HIGH (ref 70–99)
Potassium: 4.3 mmol/L (ref 3.5–5.1)
Sodium: 136 mmol/L (ref 135–145)

## 2023-09-28 LAB — CBC
HCT: 46.2 % (ref 39.0–52.0)
Hemoglobin: 15.3 g/dL (ref 13.0–17.0)
MCH: 28.8 pg (ref 26.0–34.0)
MCHC: 33.1 g/dL (ref 30.0–36.0)
MCV: 86.8 fL (ref 80.0–100.0)
Platelets: 343 10*3/uL (ref 150–400)
RBC: 5.32 MIL/uL (ref 4.22–5.81)
RDW: 13 % (ref 11.5–15.5)
WBC: 6.2 10*3/uL (ref 4.0–10.5)
nRBC: 0 % (ref 0.0–0.2)

## 2023-09-28 LAB — TROPONIN I (HIGH SENSITIVITY)
Troponin I (High Sensitivity): <2 ng/L (ref <18)
Troponin I (High Sensitivity): <2 ng/L (ref <18)

## 2023-09-28 LAB — D-DIMER, QUANTITATIVE: D-Dimer, Quant: <0.27 ug{FEU}/mL (ref 0.00–0.50)

## 2023-09-28 NOTE — Discharge Instructions (Signed)
 It was a pleasure taking part in your care.  As discussed, your workup is reassuring.  Please follow-up with your outpatient cardiology team.  Please return to the ED with new or worsening symptoms.  Continue taking all medications as prescribed.

## 2023-09-28 NOTE — ED Triage Notes (Signed)
 Pt c/o chest pain  sob chills around 2130  cbg high

## 2023-09-28 NOTE — ED Provider Notes (Signed)
 [place] EMERGENCY DEPARTMENT AT [facility] Provider Note   CSN: [id number] Arrival date & time: 09/27/23  2206     History  Chief Complaint  Patient presents with   Chest Pain    Dave Carter is a 43 y.o. male with medical history of kidney stones, chest pain.  Patient presents to ED for evaluation of chest pain.  Reports last night he was driving back from [place].  States that he was in the car when he had a sudden onset of right sided chest pain that did not radiate, lasted for 30 seconds and then resolved.  Reports that during this event he was "breathing harder" but he denies any overt shortness of breath.  He reports a history of chest pain, sees cardiology with [facility].  Reports last appointment was in February, had unremarkable scan done.  Reports that he also took his blood pressure at that time and was noted to be elevated but he had not taken his lisinopril yet.  Denies blurred vision, headaches.  Denies nausea, vomiting, abdominal pain, lightheadedness, dizziness or weakness.  Denies any repeat instances of chest pain ever since the initial chest pain episode at 930.  Denies leg swelling.   Chest Pain      Home Medications Prior to Admission medications   Medication Sig Start Date End Date Taking? Authorizing Provider  acetaminophen (TYLENOL) 325 MG tablet Take 325 mg by mouth every 6 (six) hours as needed. For pain     [provider]  cetirizine (ZYRTEC) 10 MG tablet Take by mouth. Patient not taking: Reported on 08/08/2022    [provider]  famotidine (PEPCID) 20 MG tablet Take 1 tablet (20 mg total) by mouth 2 (two) times daily. 08/08/22   Ellsworth Lennox, PA-C  HYDROcodone-acetaminophen (NORCO/VICODIN) 5-325 MG tablet Take 2 tablets by mouth every 6 (six) hours as needed for severe pain. Patient not taking: Reported on 08/08/2022 04/25/19   Derwood Kaplan, MD  ibuprofen (ADVIL) 600 MG tablet Take 1 tablet (600 mg total) by mouth  every 6 (six) hours as needed. 04/25/19   Derwood Kaplan, MD  lisinopril (ZESTRIL) 10 MG tablet Take 10 mg by mouth daily.    [provider]  meloxicam (MOBIC) 15 MG tablet TAKE 1 TABLET BY MOUTH EVERY DAY Patient not taking: Reported on 08/08/2022 05/08/18   Vivi Barrack, DPM  metFORMIN (GLUCOPHAGE) 500 MG tablet  02/26/18   [provider]  metFORMIN (GLUCOPHAGE) 500 MG tablet Take by mouth. 08/21/18   [provider]  methylPREDNISolone (MEDROL DOSEPAK) 4 MG TBPK tablet Take as directed Patient not taking: Reported on 08/08/2022 04/07/17   Vivi Barrack, DPM  Multiple Vitamins-Minerals (MULTIVITAMINS THER. W/MINERALS) TABS Take 1 tablet by mouth daily.      [provider]  NIFEdipine (PROCARDIA) 10 MG capsule Take 1 capsule (10 mg total) by mouth 3 (three) times daily. For esophageal spasm. 08/08/22   Ellsworth Lennox, PA-C  rosuvastatin (CRESTOR) 10 MG tablet Take by mouth. Patient not taking: Reported on 08/08/2022 04/08/19   [provider]      Allergies    Patient has no known allergies.    Review of Systems   Review of Systems  Cardiovascular:  Positive for chest pain.  All other systems reviewed and are negative.   Physical Exam Updated Vital Signs BP 137/86   Pulse 73   Temp 98 F (36.7 C)   Resp 14   Ht 5\' 10"  (  1.778 m)   Wt 77.6 kg   SpO2 97%   BMI 24.55 kg/m  Physical Exam Vitals and nursing note reviewed.  Constitutional:      General: He is not in acute distress.    Appearance: He is well-developed.  HENT:     Head: Normocephalic and atraumatic.  Eyes:     Conjunctiva/sclera: Conjunctivae normal.  Cardiovascular:     Rate and Rhythm: Normal rate and regular rhythm.     Heart sounds: No murmur heard. Pulmonary:     Effort: Pulmonary effort is normal. No respiratory distress.     Breath sounds: Normal breath sounds.  Abdominal:     Palpations: Abdomen is soft.     Tenderness: There is no abdominal  tenderness.  Musculoskeletal:        General: No swelling.     Cervical back: Neck supple.     Right lower leg: No edema.     Left lower leg: No edema.  Skin:    General: Skin is warm and dry.     Capillary Refill: Capillary refill takes less than 2 seconds.  Neurological:     Mental Status: He is alert and oriented to person, place, and time.  Psychiatric:        Mood and Affect: Mood normal.     ED Results / Procedures / Treatments   Labs (all labs ordered are listed, but only abnormal results are displayed) Labs Reviewed  BASIC METABOLIC PANEL WITH GFR - Abnormal; Notable for the following components:      Result Value   CO2 20 (*)    Glucose, Bld 312 (*)    Calcium 8.7 (*)    All other components within normal limits  CBC  D-DIMER, QUANTITATIVE  TROPONIN I (HIGH SENSITIVITY)  TROPONIN I (HIGH SENSITIVITY)    EKG None  Radiology DG Chest 2 View Result Date: 09/28/2023 CLINICAL DATA:  Chest pain. EXAM: CHEST - 2 VIEW COMPARISON:  August 08, 2022 FINDINGS: The heart size and mediastinal contours are within normal limits. Low lung volumes are noted. Both lungs are clear. The visualized skeletal structures are unremarkable. IMPRESSION: No active cardiopulmonary disease. Electronically Signed   By: Aram Candela M.D.   On: 09/28/2023 00:38    Procedures Procedures   Medications Ordered in ED Medications - No data to display  ED Course/ Medical Decision Making/ A&P   Medical Decision Making Amount and/or Complexity of Data Reviewed Labs: ordered.   43 year old presents for evaluation.  Please see HPI for further details.  On examination patient is afebrile and tachycardic.  Lung sounds are clear bilaterally, he is not hypoxic.  Abdomen soft compressible.  Neurological examination is at baseline.  No edema to bilateral lower extremities.  Overall patient well-appearing, reassuring vital signs.  Will collect CBC, BMP, troponin x 2, EKG and chest x-ray.   Patient arrives tachycardic so also collect D-dimer.  CBC without leukocytosis or anemia.  Metabolic panel without electrolyte derangement, glucose 312, anion gap 12.  Patient D-dimer is negative.  Troponins negative x 2.  EKG is nonischemic.  Chest x-ray unremarkable.  Patient workup reassuring.  Negative troponin x 2.  Patient has had no recurrence of chest pain here.  Will discharge patient home and have him follow-up with his outpatient cardiology team.  Have advised patient of return precautions and he voiced understanding.  He had all of his questions answered to his satisfaction.  Stable to discharge.   Final Clinical Impression(s) / ED  Diagnoses Final diagnoses:  Chest pain, unspecified type    Rx / DC Orders ED Discharge Orders     None         Al Decant, PA-C 09/28/23 0321    Loetta Rough, MD 09/28/23 [phone]
